# Patient Record
Sex: Female | Born: 1970 | Race: White | Hispanic: No | Marital: Married | Smoking: Never smoker
Health system: Southern US, Community
[De-identification: ages and names within clinical notes are randomized; demographics above are authoritative.]

## PROBLEM LIST (undated history)

## (undated) DIAGNOSIS — Z9889 Other specified postprocedural states: Secondary | ICD-10-CM

## (undated) DIAGNOSIS — F419 Anxiety disorder, unspecified: Secondary | ICD-10-CM

## (undated) DIAGNOSIS — F32A Depression, unspecified: Secondary | ICD-10-CM

## (undated) DIAGNOSIS — R112 Nausea with vomiting, unspecified: Secondary | ICD-10-CM

## (undated) DIAGNOSIS — D219 Benign neoplasm of connective and other soft tissue, unspecified: Secondary | ICD-10-CM

## (undated) DIAGNOSIS — N644 Mastodynia: Secondary | ICD-10-CM

## (undated) DIAGNOSIS — F329 Major depressive disorder, single episode, unspecified: Secondary | ICD-10-CM

## (undated) DIAGNOSIS — N63 Unspecified lump in unspecified breast: Secondary | ICD-10-CM

## (undated) HISTORY — DX: Anxiety disorder, unspecified: F41.9

## (undated) HISTORY — DX: Mastodynia: N64.4

## (undated) HISTORY — DX: Depression, unspecified: F32.A

## (undated) HISTORY — PX: BREAST FIBROADENOMA SURGERY: SHX580

## (undated) HISTORY — PX: KNEE ARTHROSCOPY: SUR90

## (undated) HISTORY — DX: Unspecified lump in unspecified breast: N63.0

## (undated) HISTORY — PX: BREAST EXCISIONAL BIOPSY: SUR124

## (undated) HISTORY — DX: Major depressive disorder, single episode, unspecified: F32.9

## (undated) HISTORY — PX: LAPAROSCOPIC ENDOMETRIOSIS FULGURATION: SUR769

---

## 2004-02-09 ENCOUNTER — Ambulatory Visit: Payer: Self-pay | Admitting: *Deleted

## 2004-02-16 ENCOUNTER — Ambulatory Visit: Payer: Self-pay | Admitting: *Deleted

## 2004-05-10 ENCOUNTER — Ambulatory Visit: Payer: Self-pay | Admitting: Family Medicine

## 2004-10-27 ENCOUNTER — Emergency Department (HOSPITAL_COMMUNITY): Admission: EM | Admit: 2004-10-27 | Discharge: 2004-10-27 | Payer: Self-pay | Admitting: Emergency Medicine

## 2005-04-01 ENCOUNTER — Encounter: Admission: RE | Admit: 2005-04-01 | Discharge: 2005-04-01 | Payer: Self-pay | Admitting: Emergency Medicine

## 2005-05-06 ENCOUNTER — Emergency Department (HOSPITAL_COMMUNITY): Admission: EM | Admit: 2005-05-06 | Discharge: 2005-05-06 | Payer: Self-pay | Admitting: Emergency Medicine

## 2006-11-10 ENCOUNTER — Emergency Department (HOSPITAL_COMMUNITY): Admission: EM | Admit: 2006-11-10 | Discharge: 2006-11-10 | Payer: Self-pay | Admitting: Family Medicine

## 2008-12-01 ENCOUNTER — Emergency Department (HOSPITAL_COMMUNITY): Admission: EM | Admit: 2008-12-01 | Discharge: 2008-12-01 | Payer: Self-pay | Admitting: Emergency Medicine

## 2010-06-04 ENCOUNTER — Other Ambulatory Visit (HOSPITAL_COMMUNITY)
Admission: RE | Admit: 2010-06-04 | Discharge: 2010-06-04 | Disposition: A | Discharge status: Home / Self Care | Payer: Self-pay | Source: Ambulatory Visit | Attending: Family Medicine | Admitting: Family Medicine

## 2010-06-04 DIAGNOSIS — Z124 Encounter for screening for malignant neoplasm of cervix: Secondary | ICD-10-CM | POA: Insufficient documentation

## 2010-09-03 ENCOUNTER — Other Ambulatory Visit: Payer: Self-pay | Admitting: Family Medicine

## 2010-09-06 ENCOUNTER — Other Ambulatory Visit: Payer: Self-pay | Admitting: Family Medicine

## 2010-09-06 ENCOUNTER — Ambulatory Visit
Admission: RE | Admit: 2010-09-06 | Discharge: 2010-09-06 | Disposition: A | Discharge status: Home / Self Care | Payer: BC Managed Care – PPO | Source: Ambulatory Visit | Attending: Family Medicine | Admitting: Family Medicine

## 2010-09-14 NOTE — Group Therapy Note (Signed)
Carolyn David, Carolyn David                   ACCOUNT NO.:  [id number]   MEDICAL RECORD NO.:  [id number]          PATIENT TYPE:  WOC   LOCATION:  [facility]                   FACILITY:  [facility]   PHYSICIAN:  Ellis Parents, MD    DATE OF BIRTH:  August 16, 1970   DATE OF SERVICE:  02/09/2004                                    CLINIC NOTE   REASON FOR VISIT:  This 40 year old nulliparous female comes in for a Pap  smear and refill of her birth control pills and a refill on an  antidepressant, generic Paxil.  The patient's past medical history is  essentially negative.  She had a diagnostic laparoscopy because of  progressive dysmenorrhea on Aug 29, 2003 at [place], [place] and the  findings were normal.  The patient is on Ortho-Cyclen continuously for 90  days and is now doing very well with no dysmenorrhea  and minimal withdrawal  bleeding.  The patient is currently having her menstrual period so  examination is deferred at this time.  The patient did have some counseling  in [place] for mild depression and has responded very well to Paxil.  She is  given a prescription today for generic Paxil 20 mg to be taken one daily #90  and to be seen back here in 1 week for a Pap smear.      SA/MEDQ  D:  02/09/2004  T:  02/09/2004  Job:  [id number]

## 2010-09-14 NOTE — Group Therapy Note (Signed)
[name], Carolyn David                   ACCOUNT NO.:  [id number]   MEDICAL RECORD NO.:  [id number]          PATIENT TYPE:  WOC   LOCATION:  [facility]                   FACILITY:  [facility]   PHYSICIAN:  Tinnie Gens, MD        DATE OF BIRTH:  Feb 11, 1971   DATE OF SERVICE:  05/10/2004                                    CLINIC NOTE   CHIEF COMPLAINT:  Contraception with desired IUD.   SUBJECTIVE:  Carolyn David is here today for an IUD placement.  She has read the  appropriate information and has decided on the Mirena due to the fact that  she does have some dysmenorrhea which is controlled by her birth control  pills.   PROCEDURE:  A speculum was placed while the patient was in the lithotomy  position.  The cervix was prepped with Betadine.  A tenaculum was placed at  12 o'clock and the patient was sounded to approximately 6.5.  The uterus was  noted to be anteverted prior to insertion.  The IUD was assembled and was  easily placed into the uterus.  The strings were cut and the tenaculum was  removed.  There was minimal bleeding which was controlled with silver  nitrate from the tenaculum.  After the procedure, a bimanual exam was done  and the uterus was soft and the cervix was closed, and the strings were  easily palpated.   ASSESSMENT AND PLAN:  Intrauterine device placement.  The patient had her  IUD placed as stated above without complications.  She understands it is  good for 5 years and if she has any severe bleeding, abdominal pain, or  fever she should return to maternity admissions.  She was also instructed on  how to check for the strings.      LC/MEDQ  D:  05/10/2004  T:  05/10/2004  Job:  [id number]

## 2010-09-14 NOTE — Group Therapy Note (Signed)
Carolyn David, Carolyn David                   ACCOUNT NO.:  [id number]   MEDICAL RECORD NO.:  [id number]          PATIENT TYPE:  WOC   LOCATION:  [facility]                   FACILITY:  [facility]   PHYSICIAN:  Ellis Parents, MD    DATE OF BIRTH:  02-25-1971   DATE OF SERVICE:  02/16/2004                                    CLINIC NOTE   REASON FOR VISIT:  This patient returns today for a Pap smear.  Interesting  interval history is that the patient had an ultrasound approximately 3 years  ago because of a pelvic mass which was interpreted as a fibroid of the  uterus, which the patient said was about as big as a lemon.  The  interesting thing about the finding is that the fibroid was deviated and  present on the left side of the pelvis.  The patient has no symptoms of this  and her periods are very normal.   PELVIC EXAMINATION:  External genitalia are normal, the vagina is clear, the  cervix is nulliparous and clean.  The uterus is deviated to the left with an  easily palpable fibroid estimated to be approximately 6 x 6 cm and no other  fibroids can be individually palpated.  The fibroid is definitely deviated  to the left side of the pelvis but can be moved to a midline position.   The patient was advised to consider another ultrasound within the next year  or two for follow-up.      SA/MEDQ  D:  02/16/2004  T:  02/16/2004  Job:  [id number]

## 2010-11-19 ENCOUNTER — Encounter (INDEPENDENT_AMBULATORY_CARE_PROVIDER_SITE_OTHER): Payer: Self-pay | Admitting: General Surgery

## 2010-11-19 ENCOUNTER — Ambulatory Visit (INDEPENDENT_AMBULATORY_CARE_PROVIDER_SITE_OTHER): Payer: BC Managed Care – PPO | Admitting: General Surgery

## 2010-11-19 VITALS — BP 102/84 | HR 60 | Temp 96.0°F | Ht 61.0 in | Wt 135.0 lb

## 2010-11-19 DIAGNOSIS — N6002 Solitary cyst of left breast: Secondary | ICD-10-CM

## 2010-11-19 DIAGNOSIS — N6009 Solitary cyst of unspecified breast: Secondary | ICD-10-CM

## 2010-11-19 NOTE — Progress Notes (Signed)
Carolyn David is a 40 y.o. female.    Chief Complaint  Patient presents with  . Other    new pt- painful lt br cyst    HPI HPI This patient is referred for evaluation of the tender left breast mass which she found on self breast exam in May. She's states that that she was having some tenderness in the region of the left breast and perform self breast exam and noted a lump at the 11:00 position. She states that she does do her self breast exams routinely and had not noticed this previously. Since this was discovered, she noticed some increase in size and discomfort which led to diagnostic mammogram and ultrasound which was otherwise negative except for a 3 cm simple left breast cyst in the region of her concern. Over the last 2 months it had increased in size and then decreased in size but she reports that this is still painful and limits her exercising. She can only get relief with pressure and has been doubling up on her sports bra is for relief she denies any other masses. She denies any family history of breast cancer. 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 No family history of ovarian problems other than her sister has ovarian cysts. She does report to oral contraceptive use for the last 1-1/2 years denies any childbearing.  Past Medical History  Diagnosis Date  . Breast lump     left  . Breast pain     left  . Asthma   . Anxiety   . Depression     Past Surgical History  Procedure Date  . Breast fibroadenoma surgery     left  . Laparoscopic endometriosis fulguration     negative result  . Knee arthroscopy     right    History reviewed. No pertinent family history.  Social History History  Substance Use Topics  . Smoking status: Never Smoker   . Smokeless tobacco: Not on file  . Alcohol Use: Yes    No Known Allergies  Current  Outpatient Prescriptions  Medication Sig Dispense Refill  . Cetirizine HCl (ZYRTEC ALLERGY PO) Take by mouth daily.        . norgestimate-ethinyl estradiol (ORTHO-CYCLEN) 0.25-35 MG-MCG per tablet Take 1 tablet by mouth daily.        Marland Kitchen PARoxetine (PAXIL) 10 MG tablet Take 10 mg by mouth every morning.          Review of Systems Review of Systems  Constitutional: Negative.   HENT: Negative.   Eyes: Negative.   Respiratory: Negative.   Cardiovascular: Negative.   Gastrointestinal: Negative.   Genitourinary: Negative.   Musculoskeletal: Negative.   Skin: Negative.   Neurological: Negative.   Endo/Heme/Allergies: Negative.   Psychiatric/Behavioral: Negative.     Physical Exam Physical Exam  Constitutional: She is oriented to person, place, and time. She appears well-developed and well-nourished. No distress.  HENT:  Head: Normocephalic and atraumatic.  Mouth/Throat: No oropharyngeal exudate.  Eyes: Conjunctivae and EOM are normal. Pupils are equal, round, and reactive to light. Right eye exhibits no discharge. Left eye exhibits no discharge. No scleral icterus.  Neck: Normal range of motion. Neck supple. No tracheal deviation present.  Cardiovascular: Normal rate, regular rhythm and normal heart sounds.   Respiratory: Effort normal and breath sounds normal. No  stridor. She has no wheezes. She has no rales.       Right breast: no skin changes, LAD, or suspicious masses.  Left breast: No skin changes or LAD, left breast mass at 11oclock at border of NAC just superior to prior bx scar.  [place] with high-frequency probe shows 3 cm x 3cm x 1.5cm simple cyst in area of tenderness and palpable mass, no shadowing, aspirated 10ml of thin brown/green fluid without blood. Complete resolution of cyst on [place] and exam.  Cyst fluid not sent for pathology  GI: Soft. Bowel sounds are normal. She exhibits no distension and no mass. There is no tenderness. There is no rebound and no guarding.    Musculoskeletal: Normal range of motion. She exhibits no edema and no tenderness.  Neurological: She is alert and oriented to person, place, and time. She has normal reflexes.  Skin: Skin is warm and dry. No rash noted. She is not diaphoretic. No erythema. No pallor.  Psychiatric: She has a normal mood and affect. Her behavior is normal. Judgment and thought content normal.     Blood pressure 102/84, pulse 60, temperature 96 F (35.6 C), height 5\' 1"  (1.549 m), weight 135 lb (61.236 kg).  Assessment/Plan Simple left breast cyst  Given the fact that her left breast cyst was symptomatic, I discussed with her the risks of continued observation with self breast exam and repeat ultrasound versus aspiration in clinic today for relief of her symptoms versus surgical excision and biopsy and the risks and benefits of each. She elected to have a cyst aspiration which we performed at the bedside with ultrasound guidance today. She had complete resolution of her cyst on imaging and on physical exam. She will followup with me in 3 months with a repeat ultrasound of the left breast and continued monthly self breast exam. If the cyst recurs or her symptoms recur then she will follow up sooner. If the cyst does recur we will consider repeat aspiration or surgical excision. Given the appearance on [place] and the history and exam I doubt that this is a malignant lesion.  Carolyn David 11/19/2010, 11:09 AM

## 2011-02-05 ENCOUNTER — Encounter (INDEPENDENT_AMBULATORY_CARE_PROVIDER_SITE_OTHER): Payer: Self-pay | Admitting: General Surgery

## 2011-02-12 LAB — POCT H PYLORI SCREEN: H. PYLORI SCREEN, POC: NEGATIVE

## 2011-02-12 LAB — CBC
HCT: 37.2
Hemoglobin: 12.2
MCHC: 32.9
MCV: 77.6 — ABNORMAL LOW
Platelets: 194
RBC: 4.8
RDW: 14.9 — ABNORMAL HIGH
WBC: 4.5

## 2011-02-12 LAB — DIFFERENTIAL
Basophils Absolute: 0
Basophils Relative: 1
Eosinophils Absolute: 0.2
Eosinophils Relative: 4
Lymphocytes Relative: 30
Lymphs Abs: 1.3
Monocytes Absolute: 0.5
Monocytes Relative: 11
Neutro Abs: 2.5
Neutrophils Relative %: 56

## 2011-02-19 ENCOUNTER — Other Ambulatory Visit: Payer: BC Managed Care – PPO

## 2011-07-25 ENCOUNTER — Other Ambulatory Visit (HOSPITAL_COMMUNITY)
Admission: RE | Admit: 2011-07-25 | Discharge: 2011-07-25 | Disposition: A | Discharge status: Home / Self Care | Payer: BC Managed Care – PPO | Source: Ambulatory Visit | Attending: Obstetrics and Gynecology | Admitting: Obstetrics and Gynecology

## 2011-07-25 DIAGNOSIS — Z01419 Encounter for gynecological examination (general) (routine) without abnormal findings: Secondary | ICD-10-CM | POA: Insufficient documentation

## 2011-08-08 ENCOUNTER — Other Ambulatory Visit: Payer: Self-pay | Admitting: Obstetrics and Gynecology

## 2011-08-08 DIAGNOSIS — D219 Benign neoplasm of connective and other soft tissue, unspecified: Secondary | ICD-10-CM

## 2011-08-12 ENCOUNTER — Other Ambulatory Visit: Payer: Self-pay | Admitting: Obstetrics and Gynecology

## 2011-08-12 DIAGNOSIS — D219 Benign neoplasm of connective and other soft tissue, unspecified: Secondary | ICD-10-CM

## 2011-08-12 DIAGNOSIS — N920 Excessive and frequent menstruation with regular cycle: Secondary | ICD-10-CM

## 2011-08-13 ENCOUNTER — Ambulatory Visit
Admission: RE | Admit: 2011-08-13 | Discharge: 2011-08-13 | Disposition: A | Discharge status: Home / Self Care | Payer: BC Managed Care – PPO | Source: Ambulatory Visit | Attending: Obstetrics and Gynecology | Admitting: Obstetrics and Gynecology

## 2011-08-13 DIAGNOSIS — D219 Benign neoplasm of connective and other soft tissue, unspecified: Secondary | ICD-10-CM

## 2011-08-13 HISTORY — DX: Benign neoplasm of connective and other soft tissue, unspecified: D21.9

## 2011-08-13 NOTE — Progress Notes (Signed)
LMP:  08/06/2011.  Length of cycles: 5-7 days, with 2 days of heavy flow w/ clots.  Using pads & tampons, changes Q 2-3 hrs on heavy days.    Bulk Sx:  Urinary frequency;  Stress incontinence.  Pelvic pain w/ intercourse.  G:  0  To adopt newborn in August 2013.   Open adoption.

## 2011-08-16 ENCOUNTER — Ambulatory Visit
Admission: RE | Admit: 2011-08-16 | Discharge: 2011-08-16 | Disposition: A | Discharge status: Home / Self Care | Payer: BC Managed Care – PPO | Source: Ambulatory Visit | Attending: Obstetrics and Gynecology | Admitting: Obstetrics and Gynecology

## 2011-08-16 DIAGNOSIS — N920 Excessive and frequent menstruation with regular cycle: Secondary | ICD-10-CM

## 2011-08-16 DIAGNOSIS — D219 Benign neoplasm of connective and other soft tissue, unspecified: Secondary | ICD-10-CM

## 2011-08-16 MED ORDER — GADOBENATE DIMEGLUMINE 529 MG/ML IV SOLN
12.0000 mL | Freq: Once | INTRAVENOUS | Status: AC | PRN
  Administered 2011-08-16: 12 mL via INTRAVENOUS

## 2012-08-27 ENCOUNTER — Other Ambulatory Visit: Payer: Self-pay | Admitting: Family Medicine

## 2012-08-27 DIAGNOSIS — D219 Benign neoplasm of connective and other soft tissue, unspecified: Secondary | ICD-10-CM

## 2012-09-22 ENCOUNTER — Ambulatory Visit
Admission: RE | Admit: 2012-09-22 | Discharge: 2012-09-22 | Disposition: A | Discharge status: Home / Self Care | Payer: BC Managed Care – PPO | Source: Ambulatory Visit | Attending: Family Medicine | Admitting: Family Medicine

## 2012-09-22 DIAGNOSIS — D219 Benign neoplasm of connective and other soft tissue, unspecified: Secondary | ICD-10-CM

## 2012-09-24 ENCOUNTER — Other Ambulatory Visit: Payer: Self-pay | Admitting: Obstetrics and Gynecology

## 2012-09-24 DIAGNOSIS — D259 Leiomyoma of uterus, unspecified: Secondary | ICD-10-CM

## 2012-10-08 ENCOUNTER — Ambulatory Visit
Admission: RE | Admit: 2012-10-08 | Discharge: 2012-10-08 | Disposition: A | Discharge status: Home / Self Care | Payer: BC Managed Care – PPO | Source: Ambulatory Visit | Attending: Obstetrics and Gynecology | Admitting: Obstetrics and Gynecology

## 2012-10-08 DIAGNOSIS — D259 Leiomyoma of uterus, unspecified: Secondary | ICD-10-CM

## 2012-10-08 MED ORDER — GADOBENATE DIMEGLUMINE 529 MG/ML IV SOLN
12.0000 mL | Freq: Once | INTRAVENOUS | Status: AC | PRN
  Administered 2012-10-08: 12 mL via INTRAVENOUS

## 2012-10-15 ENCOUNTER — Other Ambulatory Visit: Payer: Self-pay | Admitting: Oncology

## 2012-10-15 ENCOUNTER — Telehealth: Payer: Self-pay | Admitting: Emergency Medicine

## 2012-10-15 ENCOUNTER — Other Ambulatory Visit: Payer: Self-pay | Admitting: Diagnostic Radiology

## 2012-10-15 DIAGNOSIS — N920 Excessive and frequent menstruation with regular cycle: Secondary | ICD-10-CM

## 2012-10-15 NOTE — Telephone Encounter (Signed)
LM FOR PT TO CALL BACK ABOUT MRI RESULTS AND [place] PROCEDURE.  12:11PM- PT CALLED BACK TO SAY SHE IS GOOD TO GO FOR THE [place] W/ DR HENN.  WE WILL SUBMIT TO [employer] AND WILL HAVE TINA TO CONTACT HER TO SET UP APPT.

## 2012-12-17 ENCOUNTER — Encounter (HOSPITAL_COMMUNITY): Payer: Self-pay | Admitting: Pharmacy Technician

## 2012-12-22 ENCOUNTER — Other Ambulatory Visit: Payer: Self-pay | Admitting: Radiology

## 2012-12-24 ENCOUNTER — Ambulatory Visit (HOSPITAL_COMMUNITY)
Admission: RE | Admit: 2012-12-24 | Discharge: 2012-12-24 | Disposition: A | Discharge status: Home / Self Care | Payer: BC Managed Care – PPO | Source: Ambulatory Visit | Attending: Diagnostic Radiology | Admitting: Diagnostic Radiology

## 2012-12-24 ENCOUNTER — Observation Stay (HOSPITAL_COMMUNITY)
Admission: RE | Admit: 2012-12-24 | Discharge: 2012-12-25 | Disposition: A | Discharge status: Home / Self Care | Payer: BC Managed Care – PPO | Source: Ambulatory Visit | Attending: Diagnostic Radiology | Admitting: Diagnostic Radiology

## 2012-12-24 ENCOUNTER — Encounter (HOSPITAL_COMMUNITY): Payer: Self-pay

## 2012-12-24 ENCOUNTER — Other Ambulatory Visit: Payer: Self-pay | Admitting: Diagnostic Radiology

## 2012-12-24 ENCOUNTER — Other Ambulatory Visit: Payer: Self-pay | Admitting: Radiology

## 2012-12-24 VITALS — BP 116/66 | HR 82 | Temp 98.1°F | Resp 16 | Ht 61.0 in | Wt 140.0 lb

## 2012-12-24 DIAGNOSIS — D259 Leiomyoma of uterus, unspecified: Principal | ICD-10-CM | POA: Insufficient documentation

## 2012-12-24 DIAGNOSIS — N920 Excessive and frequent menstruation with regular cycle: Secondary | ICD-10-CM

## 2012-12-24 DIAGNOSIS — Z79899 Other long term (current) drug therapy: Secondary | ICD-10-CM | POA: Insufficient documentation

## 2012-12-24 DIAGNOSIS — D219 Benign neoplasm of connective and other soft tissue, unspecified: Secondary | ICD-10-CM | POA: Diagnosis present

## 2012-12-24 DIAGNOSIS — J45909 Unspecified asthma, uncomplicated: Secondary | ICD-10-CM | POA: Insufficient documentation

## 2012-12-24 HISTORY — DX: Other specified postprocedural states: Z98.890

## 2012-12-24 HISTORY — DX: Nausea with vomiting, unspecified: R11.2

## 2012-12-24 LAB — BASIC METABOLIC PANEL
BUN: 9 mg/dL (ref 6–23)
CO2: 24 mEq/L (ref 19–32)
Glucose, Bld: 83 mg/dL (ref 70–99)
Potassium: 3.7 mEq/L (ref 3.5–5.1)
Sodium: 134 mEq/L — ABNORMAL LOW (ref 135–145)

## 2012-12-24 LAB — CBC
Hemoglobin: 10.8 g/dL — ABNORMAL LOW (ref 12.0–15.0)
MCH: 22.9 pg — ABNORMAL LOW (ref 26.0–34.0)
MCHC: 31.8 g/dL (ref 30.0–36.0)
MCV: 72.2 fL — ABNORMAL LOW (ref 78.0–100.0)
RBC: 4.71 MIL/uL (ref 3.87–5.11)

## 2012-12-24 MED ORDER — HYDROMORPHONE HCL PF 2 MG/ML IJ SOLN
INTRAMUSCULAR | Status: AC
  Filled 2012-12-24: qty 1

## 2012-12-24 MED ORDER — PROMETHAZINE HCL 25 MG PO TABS: 25.0000 mg | ORAL_TABLET | Freq: Three times a day (TID) | ORAL | Status: DC | PRN

## 2012-12-24 MED ORDER — KETOROLAC TROMETHAMINE 30 MG/ML IJ SOLN
INTRAMUSCULAR | Status: AC
  Filled 2012-12-24: qty 1

## 2012-12-24 MED ORDER — DIPHENHYDRAMINE HCL 50 MG/ML IJ SOLN: 12.5000 mg | Freq: Four times a day (QID) | INTRAMUSCULAR | Status: DC | PRN

## 2012-12-24 MED ORDER — SODIUM CHLORIDE 0.9 % IJ SOLN: 3.0000 mL | INTRAMUSCULAR | Status: DC | PRN

## 2012-12-24 MED ORDER — MIDAZOLAM HCL 2 MG/2ML IJ SOLN
INTRAMUSCULAR | Status: AC | PRN
  Administered 2012-12-24 (×6): 1 mg via INTRAVENOUS

## 2012-12-24 MED ORDER — LIDOCAINE HCL 1 % IJ SOLN
INTRAMUSCULAR | Status: AC
  Filled 2012-12-24: qty 20

## 2012-12-24 MED ORDER — SODIUM CHLORIDE 0.9 % IV SOLN: 250.0000 mL | INTRAVENOUS | Status: DC | PRN

## 2012-12-24 MED ORDER — ONDANSETRON HCL 4 MG/2ML IJ SOLN: 4.0000 mg | Freq: Four times a day (QID) | INTRAMUSCULAR | Status: DC | PRN

## 2012-12-24 MED ORDER — DIPHENHYDRAMINE HCL 12.5 MG/5ML PO ELIX: 12.5000 mg | ORAL_SOLUTION | Freq: Four times a day (QID) | ORAL | Status: DC | PRN

## 2012-12-24 MED ORDER — IOHEXOL 300 MG/ML  SOLN
100.0000 mL | Freq: Once | INTRAMUSCULAR | Status: AC | PRN
  Administered 2012-12-24: 1 mL

## 2012-12-24 MED ORDER — SODIUM CHLORIDE 0.9 % IV SOLN
INTRAVENOUS | Status: DC
  Administered 2012-12-24 (×2): via INTRAVENOUS

## 2012-12-24 MED ORDER — KETOROLAC TROMETHAMINE 30 MG/ML IJ SOLN
30.0000 mg | Freq: Four times a day (QID) | INTRAMUSCULAR | Status: DC
  Administered 2012-12-24 – 2012-12-25 (×3): 30 mg via INTRAVENOUS
  Filled 2012-12-24 (×3): qty 1

## 2012-12-24 MED ORDER — PROMETHAZINE HCL 25 MG RE SUPP
25.0000 mg | Freq: Three times a day (TID) | RECTAL | Status: DC | PRN
  Filled 2012-12-24: qty 1

## 2012-12-24 MED ORDER — CEFAZOLIN SODIUM-DEXTROSE 2-3 GM-% IV SOLR
INTRAVENOUS | Status: AC
  Filled 2012-12-24: qty 50

## 2012-12-24 MED ORDER — SODIUM CHLORIDE 0.9 % IJ SOLN: 9.0000 mL | INTRAMUSCULAR | Status: DC | PRN

## 2012-12-24 MED ORDER — DOCUSATE SODIUM 100 MG PO CAPS
100.0000 mg | ORAL_CAPSULE | Freq: Two times a day (BID) | ORAL | Status: DC
  Administered 2012-12-24 – 2012-12-25 (×2): 100 mg via ORAL
  Filled 2012-12-24 (×3): qty 1

## 2012-12-24 MED ORDER — FENTANYL CITRATE 0.05 MG/ML IJ SOLN
INTRAMUSCULAR | Status: AC | PRN
  Administered 2012-12-24 (×4): 50 ug via INTRAVENOUS

## 2012-12-24 MED ORDER — SODIUM CHLORIDE 0.9 % IJ SOLN
3.0000 mL | Freq: Two times a day (BID) | INTRAMUSCULAR | Status: DC
  Administered 2012-12-24: 3 mL via INTRAVENOUS

## 2012-12-24 MED ORDER — HYDROMORPHONE HCL PF 1 MG/ML IJ SOLN
INTRAMUSCULAR | Status: DC | PRN
  Administered 2012-12-24: 1 mg via INTRAVENOUS

## 2012-12-24 MED ORDER — FENTANYL CITRATE 0.05 MG/ML IJ SOLN: 100.0000 ug | Freq: Once | INTRAMUSCULAR | Status: DC

## 2012-12-24 MED ORDER — MIDAZOLAM HCL 2 MG/2ML IJ SOLN
INTRAMUSCULAR | Status: AC
  Filled 2012-12-24: qty 6

## 2012-12-24 MED ORDER — HYDROMORPHONE 0.3 MG/ML IV SOLN
INTRAVENOUS | Status: DC
  Administered 2012-12-24: 9 mg via INTRAVENOUS
  Administered 2012-12-24 (×2): via INTRAVENOUS
  Administered 2012-12-25: 2.7 mg via INTRAVENOUS
  Administered 2012-12-25: 6.3 mg via INTRAVENOUS
  Administered 2012-12-25: 3.45 mg via INTRAVENOUS
  Filled 2012-12-24 (×3): qty 25

## 2012-12-24 MED ORDER — CEFAZOLIN SODIUM-DEXTROSE 2-3 GM-% IV SOLR
2.0000 g | INTRAVENOUS | Status: AC
  Administered 2012-12-24: 2 g via INTRAVENOUS

## 2012-12-24 MED ORDER — HYDROMORPHONE HCL PF 1 MG/ML IJ SOLN
INTRAMUSCULAR | Status: AC | PRN
  Administered 2012-12-24: 0.5 mg via INTRAVENOUS
  Administered 2012-12-24: 1 mg via INTRAVENOUS
  Administered 2012-12-24: 0.5 mg via INTRAVENOUS
  Administered 2012-12-24: 1 mg via INTRAVENOUS

## 2012-12-24 MED ORDER — NALOXONE HCL 0.4 MG/ML IJ SOLN: 0.4000 mg | INTRAMUSCULAR | Status: DC | PRN

## 2012-12-24 MED ORDER — FENTANYL CITRATE 0.05 MG/ML IJ SOLN
INTRAMUSCULAR | Status: AC
  Administered 2012-12-24: 14:00:00
  Filled 2012-12-24: qty 4

## 2012-12-24 MED ORDER — HYDROMORPHONE 0.3 MG/ML IV SOLN: INTRAVENOUS | Status: DC

## 2012-12-24 MED ORDER — KETOROLAC TROMETHAMINE 30 MG/ML IJ SOLN
30.0000 mg | INTRAMUSCULAR | Status: AC
  Administered 2012-12-24: 30 mg via INTRAVENOUS
  Filled 2012-12-24: qty 1

## 2012-12-24 MED ORDER — FENTANYL CITRATE 0.05 MG/ML IJ SOLN
INTRAMUSCULAR | Status: AC
  Filled 2012-12-24: qty 6

## 2012-12-24 NOTE — Progress Notes (Signed)
Report called to RN, Pt transferred to room 1534 in stable condition.

## 2012-12-24 NOTE — Progress Notes (Signed)
Subjective: Pt doing fairly well; has some intermittent pelvic cramping ; denies N/V  Objective: Vital signs in last 24 hours: Temp:  [98 F (36.7 C)] 98 F (36.7 C) (08/28 1215) Pulse Rate:  [65-82] 79 (08/28 1400) Resp:  [8-18] 18 (08/28 1400) BP: (103-148)/(63-92) 144/72 mmHg (08/28 1400) SpO2:  [97 %-100 %] 97 % (08/28 1400) FiO2 (%):  [82 %] 82 % (08/28 1300) Weight:  [140 lb (63.504 kg)] 140 lb (63.504 kg) (08/28 0739)    Intake/Output from previous day:   Intake/Output this shift:    Pt awake, sl drowsy; puncture site right CFA clean and dry,NT, no hematoma; intact distal pulses; mild pelvic tenderness  Lab Results:   Recent Labs  12/24/12 0755  WBC 4.8  HGB 10.8*  HCT 34.0*  PLT 252   BMET  Recent Labs  12/24/12 0755  NA 134*  K 3.7  CL 100  CO2 24  GLUCOSE 83  BUN 9  CREATININE 0.71  CALCIUM 8.6   PT/INR  Recent Labs  12/24/12 0755  LABPROT 12.3  INR 0.93   ABG No results found for this basename: PHART, PCO2, PO2, HCO3,  in the last 72 hours  Studies/Results: Ir Angiogram Pelvis Selective Or Supraselective  12/24/2012   *RADIOLOGY REPORT*  Indication: 42 year old with menorrhagia and uterine fibroids  PROCEDURE(S):  BILATERAL UTERINE ARTERY EMBOLIZATION; BILATERAL PELVIC ANGIOGRAPHY; ULTRASOUND GUIDANCE FOR VASCULAR ACCESS  Physician:  Rachelle Hora. Henn, MD  Medications: Versed 6 mg, Fentanyl 200 mcg.  Toradol 30 mg, Dilaudid 2 mg.  Ancef 2 grams. A radiology nurse monitored the patient for moderate sedation.  As antibiotic prophylaxis, Ancef was ordered pre-procedure and administered intravenously within one hour of incision.  Moderate sedation time: 1 hour and 45 minutes  Fluoroscopy time:  31 minutes and 6 seconds  Procedure:The procedure was explained to the patient.  The risks and benefits of the procedure were discussed and the patient's questions were addressed.  Informed consent was obtained from the patient.  The patient was placed supine  on the interventional table.  The patient had palpable pedal pulses.  The right groin was prepped and draped in a sterile fashion.  Maximal barrier sterile technique was utilized including caps, mask, sterile gowns, sterile gloves, sterile drape, hand hygiene and skin antiseptic.  The skin was anesthetized 1% lidocaine.  Using ultrasound guidance, 21 gauge needle was directed in the right common femoral artery and a micropuncture dilator set was placed.  The vascular access was upsized to a 5-French vascular sheath.  A Cobra catheter was used to cannulate the left common iliac artery and the left internal iliac artery.  A series of arteriograms were performed to identify the left uterine artery origin.  A Progreat microcatheter was advanced into the left uterine artery.  Two vials of Embospheres (500 - 700 micron) were injected through the microcatheter with fluoroscopic guidance.  There was near complete stasis of the left uterine artery following administration of the particles on the followup angiograms.  A Waltman's loop was formed using the Cobra catheter and the right internal iliac artery was cannulated.  The right uterine artery was identified with contrast angiograms.  The microcatheter was advanced into the right uterine artery and a series of angiograms were performed.  One vial of Embospheres (500- 700 micron) was injected through the microcatheter with fluoroscopic guidance.  Post embolization angiography demonstrated near complete stasis of the right uterine artery at the end of the procedure.  The microcatheter was removed.  The Cobra catheter was straightened out over the aortic bifurcation and removed over a wire.  Angiogram was performed through the right groin sheath.  The vascular sheath was removed with an Exoseal closure device.Fluoroscopic and ultrasound images were taken and saved for documentation.  Findings:Large uterine arteries bilaterally.  Near complete stasis of the uterine arteries at  the end of the procedure.  Extensive contrast staining of the dominant fibroids during and after the embolization procedure.  Complicatons: None  Impression:Successful uterine artery embolization procedure.   Original Report Authenticated By: Richarda Overlie, M.D.   Ir Angiogram Pelvis Selective Or Supraselective  12/24/2012   *RADIOLOGY REPORT*  Indication: 42 year old with menorrhagia and uterine fibroids  PROCEDURE(S):  BILATERAL UTERINE ARTERY EMBOLIZATION; BILATERAL PELVIC ANGIOGRAPHY; ULTRASOUND GUIDANCE FOR VASCULAR ACCESS  Physician:  Rachelle Hora. Henn, MD  Medications: Versed 6 mg, Fentanyl 200 mcg.  Toradol 30 mg, Dilaudid 2 mg.  Ancef 2 grams. A radiology nurse monitored the patient for moderate sedation.  As antibiotic prophylaxis, Ancef was ordered pre-procedure and administered intravenously within one hour of incision.  Moderate sedation time: 1 hour and 45 minutes  Fluoroscopy time:  31 minutes and 6 seconds  Procedure:The procedure was explained to the patient.  The risks and benefits of the procedure were discussed and the patient's questions were addressed.  Informed consent was obtained from the patient.  The patient was placed supine on the interventional table.  The patient had palpable pedal pulses.  The right groin was prepped and draped in a sterile fashion.  Maximal barrier sterile technique was utilized including caps, mask, sterile gowns, sterile gloves, sterile drape, hand hygiene and skin antiseptic.  The skin was anesthetized 1% lidocaine.  Using ultrasound guidance, 21 gauge needle was directed in the right common femoral artery and a micropuncture dilator set was placed.  The vascular access was upsized to a 5-French vascular sheath.  A Cobra catheter was used to cannulate the left common iliac artery and the left internal iliac artery.  A series of arteriograms were performed to identify the left uterine artery origin.  A Progreat microcatheter was advanced into the left uterine artery.   Two vials of Embospheres (500 - 700 micron) were injected through the microcatheter with fluoroscopic guidance.  There was near complete stasis of the left uterine artery following administration of the particles on the followup angiograms.  A Waltman's loop was formed using the Cobra catheter and the right internal iliac artery was cannulated.  The right uterine artery was identified with contrast angiograms.  The microcatheter was advanced into the right uterine artery and a series of angiograms were performed.  One vial of Embospheres (500- 700 micron) was injected through the microcatheter with fluoroscopic guidance.  Post embolization angiography demonstrated near complete stasis of the right uterine artery at the end of the procedure.  The microcatheter was removed.  The Cobra catheter was straightened out over the aortic bifurcation and removed over a wire.  Angiogram was performed through the right groin sheath.  The vascular sheath was removed with an Exoseal closure device.Fluoroscopic and ultrasound images were taken and saved for documentation.  Findings:Large uterine arteries bilaterally.  Near complete stasis of the uterine arteries at the end of the procedure.  Extensive contrast staining of the dominant fibroids during and after the embolization procedure.  Complicatons: None  Impression:Successful uterine artery embolization procedure.   Original Report Authenticated By: Richarda Overlie, M.D.   Ir Angiogram Selective Each Additional Vessel  12/24/2012   *RADIOLOGY  REPORT*  Indication: 42 year old with menorrhagia and uterine fibroids  PROCEDURE(S):  BILATERAL UTERINE ARTERY EMBOLIZATION; BILATERAL PELVIC ANGIOGRAPHY; ULTRASOUND GUIDANCE FOR VASCULAR ACCESS  Physician:  Rachelle Hora. Henn, MD  Medications: Versed 6 mg, Fentanyl 200 mcg.  Toradol 30 mg, Dilaudid 2 mg.  Ancef 2 grams. A radiology nurse monitored the patient for moderate sedation.  As antibiotic prophylaxis, Ancef was ordered pre-procedure and  administered intravenously within one hour of incision.  Moderate sedation time: 1 hour and 45 minutes  Fluoroscopy time:  31 minutes and 6 seconds  Procedure:The procedure was explained to the patient.  The risks and benefits of the procedure were discussed and the patient's questions were addressed.  Informed consent was obtained from the patient.  The patient was placed supine on the interventional table.  The patient had palpable pedal pulses.  The right groin was prepped and draped in a sterile fashion.  Maximal barrier sterile technique was utilized including caps, mask, sterile gowns, sterile gloves, sterile drape, hand hygiene and skin antiseptic.  The skin was anesthetized 1% lidocaine.  Using ultrasound guidance, 21 gauge needle was directed in the right common femoral artery and a micropuncture dilator set was placed.  The vascular access was upsized to a 5-French vascular sheath.  A Cobra catheter was used to cannulate the left common iliac artery and the left internal iliac artery.  A series of arteriograms were performed to identify the left uterine artery origin.  A Progreat microcatheter was advanced into the left uterine artery.  Two vials of Embospheres (500 - 700 micron) were injected through the microcatheter with fluoroscopic guidance.  There was near complete stasis of the left uterine artery following administration of the particles on the followup angiograms.  A Waltman's loop was formed using the Cobra catheter and the right internal iliac artery was cannulated.  The right uterine artery was identified with contrast angiograms.  The microcatheter was advanced into the right uterine artery and a series of angiograms were performed.  One vial of Embospheres (500- 700 micron) was injected through the microcatheter with fluoroscopic guidance.  Post embolization angiography demonstrated near complete stasis of the right uterine artery at the end of the procedure.  The microcatheter was removed.   The Cobra catheter was straightened out over the aortic bifurcation and removed over a wire.  Angiogram was performed through the right groin sheath.  The vascular sheath was removed with an Exoseal closure device.Fluoroscopic and ultrasound images were taken and saved for documentation.  Findings:Large uterine arteries bilaterally.  Near complete stasis of the uterine arteries at the end of the procedure.  Extensive contrast staining of the dominant fibroids during and after the embolization procedure.  Complicatons: None  Impression:Successful uterine artery embolization procedure.   Original Report Authenticated By: Richarda Overlie, M.D.   Ir Angiogram Selective Each Additional Vessel  12/24/2012   *RADIOLOGY REPORT*  Indication: 42 year old with menorrhagia and uterine fibroids  PROCEDURE(S):  BILATERAL UTERINE ARTERY EMBOLIZATION; BILATERAL PELVIC ANGIOGRAPHY; ULTRASOUND GUIDANCE FOR VASCULAR ACCESS  Physician:  Rachelle Hora. Henn, MD  Medications: Versed 6 mg, Fentanyl 200 mcg.  Toradol 30 mg, Dilaudid 2 mg.  Ancef 2 grams. A radiology nurse monitored the patient for moderate sedation.  As antibiotic prophylaxis, Ancef was ordered pre-procedure and administered intravenously within one hour of incision.  Moderate sedation time: 1 hour and 45 minutes  Fluoroscopy time:  31 minutes and 6 seconds  Procedure:The procedure was explained to the patient.  The risks and benefits of the  procedure were discussed and the patient's questions were addressed.  Informed consent was obtained from the patient.  The patient was placed supine on the interventional table.  The patient had palpable pedal pulses.  The right groin was prepped and draped in a sterile fashion.  Maximal barrier sterile technique was utilized including caps, mask, sterile gowns, sterile gloves, sterile drape, hand hygiene and skin antiseptic.  The skin was anesthetized 1% lidocaine.  Using ultrasound guidance, 21 gauge needle was directed in the right common  femoral artery and a micropuncture dilator set was placed.  The vascular access was upsized to a 5-French vascular sheath.  A Cobra catheter was used to cannulate the left common iliac artery and the left internal iliac artery.  A series of arteriograms were performed to identify the left uterine artery origin.  A Progreat microcatheter was advanced into the left uterine artery.  Two vials of Embospheres (500 - 700 micron) were injected through the microcatheter with fluoroscopic guidance.  There was near complete stasis of the left uterine artery following administration of the particles on the followup angiograms.  A Waltman's loop was formed using the Cobra catheter and the right internal iliac artery was cannulated.  The right uterine artery was identified with contrast angiograms.  The microcatheter was advanced into the right uterine artery and a series of angiograms were performed.  One vial of Embospheres (500- 700 micron) was injected through the microcatheter with fluoroscopic guidance.  Post embolization angiography demonstrated near complete stasis of the right uterine artery at the end of the procedure.  The microcatheter was removed.  The Cobra catheter was straightened out over the aortic bifurcation and removed over a wire.  Angiogram was performed through the right groin sheath.  The vascular sheath was removed with an Exoseal closure device.Fluoroscopic and ultrasound images were taken and saved for documentation.  Findings:Large uterine arteries bilaterally.  Near complete stasis of the uterine arteries at the end of the procedure.  Extensive contrast staining of the dominant fibroids during and after the embolization procedure.  Complicatons: None  Impression:Successful uterine artery embolization procedure.   Original Report Authenticated By: Richarda Overlie, M.D.   Ir US Guide Vasc Access Right  12/24/2012   *RADIOLOGY REPORT*  Indication: 42 year old with menorrhagia and uterine fibroids   PROCEDURE(S):  BILATERAL UTERINE ARTERY EMBOLIZATION; BILATERAL PELVIC ANGIOGRAPHY; ULTRASOUND GUIDANCE FOR VASCULAR ACCESS  Physician:  Rachelle Hora. Henn, MD  Medications: Versed 6 mg, Fentanyl 200 mcg.  Toradol 30 mg, Dilaudid 2 mg.  Ancef 2 grams. A radiology nurse monitored the patient for moderate sedation.  As antibiotic prophylaxis, Ancef was ordered pre-procedure and administered intravenously within one hour of incision.  Moderate sedation time: 1 hour and 45 minutes  Fluoroscopy time:  31 minutes and 6 seconds  Procedure:The procedure was explained to the patient.  The risks and benefits of the procedure were discussed and the patient's questions were addressed.  Informed consent was obtained from the patient.  The patient was placed supine on the interventional table.  The patient had palpable pedal pulses.  The right groin was prepped and draped in a sterile fashion.  Maximal barrier sterile technique was utilized including caps, mask, sterile gowns, sterile gloves, sterile drape, hand hygiene and skin antiseptic.  The skin was anesthetized 1% lidocaine.  Using ultrasound guidance, 21 gauge needle was directed in the right common femoral artery and a micropuncture dilator set was placed.  The vascular access was upsized to a 5-French vascular sheath.  A Cobra catheter was used to cannulate the left common iliac artery and the left internal iliac artery.  A series of arteriograms were performed to identify the left uterine artery origin.  A Progreat microcatheter was advanced into the left uterine artery.  Two vials of Embospheres (500 - 700 micron) were injected through the microcatheter with fluoroscopic guidance.  There was near complete stasis of the left uterine artery following administration of the particles on the followup angiograms.  A Waltman's loop was formed using the Cobra catheter and the right internal iliac artery was cannulated.  The right uterine artery was identified with contrast  angiograms.  The microcatheter was advanced into the right uterine artery and a series of angiograms were performed.  One vial of Embospheres (500- 700 micron) was injected through the microcatheter with fluoroscopic guidance.  Post embolization angiography demonstrated near complete stasis of the right uterine artery at the end of the procedure.  The microcatheter was removed.  The Cobra catheter was straightened out over the aortic bifurcation and removed over a wire.  Angiogram was performed through the right groin sheath.  The vascular sheath was removed with an Exoseal closure device.Fluoroscopic and ultrasound images were taken and saved for documentation.  Findings:Large uterine arteries bilaterally.  Near complete stasis of the uterine arteries at the end of the procedure.  Extensive contrast staining of the dominant fibroids during and after the embolization procedure.  Complicatons: None  Impression:Successful uterine artery embolization procedure.   Original Report Authenticated By: Richarda Overlie, M.D.   Ir Embo Tumor Organ Ischemia Infarct Inc Guide Roadmapping  12/24/2012   *RADIOLOGY REPORT*  Indication: 42 year old with menorrhagia and uterine fibroids  PROCEDURE(S):  BILATERAL UTERINE ARTERY EMBOLIZATION; BILATERAL PELVIC ANGIOGRAPHY; ULTRASOUND GUIDANCE FOR VASCULAR ACCESS  Physician:  Rachelle Hora. Henn, MD  Medications: Versed 6 mg, Fentanyl 200 mcg.  Toradol 30 mg, Dilaudid 2 mg.  Ancef 2 grams. A radiology nurse monitored the patient for moderate sedation.  As antibiotic prophylaxis, Ancef was ordered pre-procedure and administered intravenously within one hour of incision.  Moderate sedation time: 1 hour and 45 minutes  Fluoroscopy time:  31 minutes and 6 seconds  Procedure:The procedure was explained to the patient.  The risks and benefits of the procedure were discussed and the patient's questions were addressed.  Informed consent was obtained from the patient.  The patient was placed supine on the  interventional table.  The patient had palpable pedal pulses.  The right groin was prepped and draped in a sterile fashion.  Maximal barrier sterile technique was utilized including caps, mask, sterile gowns, sterile gloves, sterile drape, hand hygiene and skin antiseptic.  The skin was anesthetized 1% lidocaine.  Using ultrasound guidance, 21 gauge needle was directed in the right common femoral artery and a micropuncture dilator set was placed.  The vascular access was upsized to a 5-French vascular sheath.  A Cobra catheter was used to cannulate the left common iliac artery and the left internal iliac artery.  A series of arteriograms were performed to identify the left uterine artery origin.  A Progreat microcatheter was advanced into the left uterine artery.  Two vials of Embospheres (500 - 700 micron) were injected through the microcatheter with fluoroscopic guidance.  There was near complete stasis of the left uterine artery following administration of the particles on the followup angiograms.  A Waltman's loop was formed using the Cobra catheter and the right internal iliac artery was cannulated.  The right uterine artery was identified  with contrast angiograms.  The microcatheter was advanced into the right uterine artery and a series of angiograms were performed.  One vial of Embospheres (500- 700 micron) was injected through the microcatheter with fluoroscopic guidance.  Post embolization angiography demonstrated near complete stasis of the right uterine artery at the end of the procedure.  The microcatheter was removed.  The Cobra catheter was straightened out over the aortic bifurcation and removed over a wire.  Angiogram was performed through the right groin sheath.  The vascular sheath was removed with an Exoseal closure device.Fluoroscopic and ultrasound images were taken and saved for documentation.  Findings:Large uterine arteries bilaterally.  Near complete stasis of the uterine arteries at the  end of the procedure.  Extensive contrast staining of the dominant fibroids during and after the embolization procedure.  Complicatons: None  Impression:Successful uterine artery embolization procedure.   Original Report Authenticated By: Richarda Overlie, M.D.    Anti-infectives: Anti-infectives   Start     Dose/Rate Route Frequency Ordered Stop   12/24/12 0745  ceFAZolin (ANCEF) IVPB 2 g/50 mL premix     2 g 100 mL/hr over 30 Minutes Intravenous On call 12/24/12 0738 12/24/12 1009      Assessment/Plan: S/p bilat [place] 8/28 for symptomatic uterine fibroids;  overnight obs for pain control; hydration; f/u with Dr. Lowella Dandy in IR clinic in 2-4 weeks  LOS: 0 days    Rajanee Schuelke,D [facility] 12/24/2012

## 2012-12-24 NOTE — ED Notes (Signed)
5Fr sheath removed from R femoral artery by Dr. Lowella Dandy.  Hemostasis achieved using Exoseal device.  R groin level 0, 2+RDP.  Manual pressure applied X5 mins.  Gauze tegaderm drsg applied.

## 2012-12-24 NOTE — Progress Notes (Signed)
Pt received to 1504 by Oletha Cruel from IR staff Loraine Leriche RN, Olegario Messier).  Unable to admit patient to 5E.  Admitting called; pt placement called by NS.  Admitting cont to work on getting patient admitted to 5E.  RN unable to see orders for patient at current time.  Pt pain level increasing.  IR called @ 1250 to request staff to come and give IV pain meds to patient.  No one available to come to 5E and give meds.

## 2012-12-24 NOTE — ED Notes (Signed)
Call report attempted.

## 2012-12-24 NOTE — Progress Notes (Signed)
Dr Lowella Dandy to unit to check on patient.  Dilaudid PCA started per Dr Lowella Dandy verbal orders.  Bolus from PCA given. See MAR.

## 2012-12-24 NOTE — H&P (Signed)
Chief Complaint: "I am here for a fibroid embolization." HPI: Carolyn David is an 42 y.o. female with PMHx of breast fibroadenoma, anemia, and uterine fibroid, discovered approximately 1 year ago. Patient c/o menorrhagia with menstrual cycle lasting 7-9 days, urinary frequency and incontinence. Patient was seen in consult by Dr. Lowella Dandy 05/14, followed by MRI 06/14. Patient is here today for uterine fibroid embolization. She denies any chest pain or shortness of breath. She denies any active bleeding. She denies any recent illness, fever or chills.   Past Medical History:  Past Medical History  Diagnosis Date  . Breast lump     left  . Breast pain     left  . Asthma   . Anxiety   . Depression   . Fibroids     Past Surgical History:  Past Surgical History  Procedure Laterality Date  . Breast fibroadenoma surgery      left  . Laparoscopic endometriosis fulguration      negative result  . Knee arthroscopy      right    Family History: History reviewed. No pertinent family history.  Social History:  reports that she has never smoked. She has never used smokeless tobacco. She reports that  drinks alcohol. She reports that she does not use illicit drugs.   Allergies  Allergen Reactions  . Erythromycin Nausea And Vomiting      Medication List    ASK your doctor about these medications       albuterol 108 (90 BASE) MCG/ACT inhaler  Commonly known as:  PROVENTIL HFA;VENTOLIN HFA  Inhale 2 puffs into the lungs every 6 (six) hours as needed for wheezing.     norethindrone-ethinyl estradiol 0.5/0.75/1-35 MG-MCG tablet  Commonly known as:  TRIPHASIL,CYCLAFEM,ALYACEN  Take 1 tablet by mouth daily.     PARoxetine 20 MG tablet  Commonly known as:  PAXIL  Take 20 mg by mouth every evening.     ZYRTEC ALLERGY PO  Take 10 mg by mouth daily.        Please HPI for pertinent positives, otherwise complete 10 system ROS negative.  Physical Exam: BP 118/69  Pulse 70  Temp(Src) 98  F (36.7 C) (Oral)  Resp 18  Ht 5\' 1"  (1.549 m)  Wt 140 lb (63.504 kg)  BMI 26.47 kg/m2  SpO2 97% Body mass index is 26.47 kg/(m^2).   General Appearance:  Alert, cooperative, no distress, appears stated age  Head:  Normocephalic, without obvious abnormality, atraumatic  Lungs:   Clear to auscultation bilaterally, no w/r/r, respirations unlabored without use of accessory muscles.  Chest Wall:  No tenderness or deformity  Heart:  Regular rate and rhythm, S1, S2 normal, no murmur, rub or gallop.  Abdomen:   Soft, non-tender, non distended.  Extremities: Extremities normal, atraumatic, no cyanosis or edema  Pulses: 2+ and symmetric  Neurologic: Normal affect, no gross deficits.   Results for orders placed during the hospital encounter of 12/24/12 (from the past 48 hour(s))  APTT     Status: None   Collection Time    12/24/12  7:55 AM      Result Value Range   aPTT 26  24 - 37 seconds  BASIC METABOLIC PANEL     Status: Abnormal   Collection Time    12/24/12  7:55 AM      Result Value Range   Sodium 134 (*) 135 - 145 mEq/L   Potassium 3.7  3.5 - 5.1 mEq/L   Chloride 100  96 - 112 mEq/L   CO2 24  19 - 32 mEq/L   Glucose, Bld 83  70 - 99 mg/dL   BUN 9  6 - 23 mg/dL   Creatinine, Ser 1.61  0.50 - 1.10 mg/dL   Calcium 8.6  8.4 - 09.6 mg/dL   GFR calc non Af Amer >90  >90 mL/min   GFR calc Af Amer >90  >90 mL/min   Comment: (NOTE)     The eGFR has been calculated using the CKD EPI equation.     This calculation has not been validated in all clinical situations.     eGFR's persistently <90 mL/min signify possible Chronic Kidney     Disease.  CBC     Status: Abnormal   Collection Time    12/24/12  7:55 AM      Result Value Range   WBC 4.8  4.0 - 10.5 K/uL   RBC 4.71  3.87 - 5.11 MIL/uL   Hemoglobin 10.8 (*) 12.0 - 15.0 g/dL   HCT 04.5 (*) 40.9 - 81.1 %   MCV 72.2 (*) 78.0 - 100.0 fL   MCH 22.9 (*) 26.0 - 34.0 pg   MCHC 31.8  30.0 - 36.0 g/dL   RDW 91.4 (*) 78.2 - 95.6 %    Platelets 252  150 - 400 K/uL  HCG, SERUM, QUALITATIVE     Status: None   Collection Time    12/24/12  7:55 AM      Result Value Range   Preg, Serum NEGATIVE  NEGATIVE   Comment:            THE SENSITIVITY OF THIS     METHODOLOGY IS >10 mIU/mL.  PROTIME-INR     Status: None   Collection Time    12/24/12  7:55 AM      Result Value Range   Prothrombin Time 12.3  11.6 - 15.2 seconds   INR 0.93  0.00 - 1.49   No results found.  Assessment/Plan Uterine fibroid with urinary frequency, incontinence, and menorrhagia.  Anemia. Consult with Dr. Lowella Dandy 08/2012. MRI 09/2012. Scheduled for uterine fibroid embolization. Labs received, patient has been NPO. Risks and Benefits discussed with the patient. All of the patient's questions were answered, patient is agreeable to proceed. Consent signed and in chart.   [name] D PA-C 12/24/2012, 8:48 AM

## 2012-12-24 NOTE — Progress Notes (Signed)
Stacey RN IR to 1504 and administered Fentanyl IVP per Dr Lowella Dandy orders.  Pt evaluated by Dr Lowella Dandy.  Pain beginning to decrease in intensity per patient.

## 2012-12-24 NOTE — Progress Notes (Signed)
Pt successfully admitted to 5E 1504.

## 2012-12-25 ENCOUNTER — Other Ambulatory Visit: Payer: Self-pay | Admitting: Radiology

## 2012-12-25 DIAGNOSIS — D219 Benign neoplasm of connective and other soft tissue, unspecified: Secondary | ICD-10-CM | POA: Diagnosis present

## 2012-12-25 MED ORDER — HYDROCODONE-ACETAMINOPHEN 5-325 MG PO TABS: 1.0000 | ORAL_TABLET | ORAL | Status: DC | PRN

## 2012-12-25 MED ORDER — DSS 100 MG PO CAPS: 100.0000 mg | ORAL_CAPSULE | Freq: Two times a day (BID) | ORAL | Status: DC

## 2012-12-25 MED ORDER — IBUPROFEN 600 MG PO TABS: 600.0000 mg | ORAL_TABLET | Freq: Three times a day (TID) | ORAL | Status: DC

## 2012-12-25 MED ORDER — PROMETHAZINE HCL 12.5 MG PO TABS: 12.5000 mg | ORAL_TABLET | Freq: Four times a day (QID) | ORAL | Status: DC | PRN

## 2012-12-25 MED ORDER — HYDROCODONE-ACETAMINOPHEN 5-325 MG PO TABS
1.0000 | ORAL_TABLET | ORAL | Status: DC | PRN
  Administered 2012-12-25: 2 via ORAL
  Filled 2012-12-25: qty 2

## 2012-12-25 NOTE — Progress Notes (Signed)
Discharge instructions given to pt, verbalized understanding. Left the unit in stable condition. 

## 2012-12-25 NOTE — Care Management Note (Signed)
    Page 1 of 1   12/25/2012     10:30:39 AM   CARE MANAGEMENT NOTE 12/25/2012  Patient:  Carolyn David, Carolyn David   Account Number:  [id number]  Date Initiated:  12/25/2012  Documentation initiated by:  Lorenda Ishihara  Subjective/Objective Assessment:   42 yo female admitted s/p uterine embolization. PTA lived at home     Action/Plan:   Home when stable   Anticipated DC Date:  12/25/2012   Anticipated DC Plan:  HOME/SELF CARE      DC Planning Services  CM consult      Choice offered to / List presented to:             Status of service:  Completed, signed off Medicare Important Message given?   (If response is "NO", the following Medicare IM given date fields will be blank) Date Medicare IM given:   Date Additional Medicare IM given:    Discharge Disposition:  HOME/SELF CARE  Per UR Regulation:  Reviewed for med. necessity/level of care/duration of stay  If discussed at Long Length of Stay Meetings, dates discussed:    Comments:

## 2012-12-25 NOTE — Discharge Summary (Signed)
Physician Discharge Summary  Patient ID: Carolyn David MRN: [id number] DOB/AGE: Apr 19, 1971 42 y.o.  Admit date: 12/24/2012 Discharge date: 12/25/2012  Admission Diagnoses: Principal Problem:   Fibroids  Discharge Diagnoses:  Principal Problem:   Fibroids    Procedures: Uterine Artery Embolization 12/24/2012  Discharged Condition: good  Hospital Course: HPI: Carolyn David is an 42 y.o. female with PMHx of breast fibroadenoma, anemia, and uterine fibroid, discovered approximately 1 year ago. Patient c/o menorrhagia with menstrual cycle lasting 7-9 days, urinary frequency and incontinence. Patient was seen in consult by Dr. Lowella Dandy 05/14, followed by MRI 06/14. Patient is here today for uterine fibroid embolization. She denies any chest pain or shortness of breath. She denies any active bleeding. She denies any recent illness, fever or chills.   Pt taken to IR suite and underwent successful uterine artery embolization via rt femoral artery access. No immediate complications. She was then admitted to floor in stable condition. Overnight had improved pain control. POD#1, minimal discomfort. No N/V, has been tolerating reg diet. Voiding well on own, no foley. She has been up and out of bed as well. Pt is deemed stable for discharge. All medications, instructions, and follow up plans have been reviewed with pt and she understands.    Consults: None   Discharge Exam: Blood pressure 127/75, pulse 67, temperature 98.8 F (37.1 C), temperature source Oral, resp. rate 12, height 5\' 1"  (1.549 m), weight 140 lb (63.504 kg), SpO2 100.00%. Lungs: CTA without w/r/r Heart: Regular Abdomen: soft, NT, active BS Ext: Rt groin soft, dry, NT. No hematoma   Disposition: Home      Discharge Orders   Future Orders Complete By Expires   Call MD for:  persistant nausea and vomiting  As directed    Call MD for:  redness, tenderness, or signs of infection (pain, swelling, redness, odor or green/yellow  discharge around incision site)  As directed    Call MD for:  severe uncontrolled pain  As directed    Call MD for:  temperature >100.4  As directed    Diet general  As directed    Driving Restrictions  As directed    Comments:     Avoid driving for 2-3 days.   Increase activity slowly  As directed    Lifting restrictions  As directed    Comments:     No heavy lifting for one week   May shower / Bathe  As directed    May walk up steps  As directed    No dressing needed  As directed    Comments:     May remove dressing at home. Place Bandaid if necessary       Medication List         albuterol 108 (90 BASE) MCG/ACT inhaler  Commonly known as:  PROVENTIL HFA;VENTOLIN HFA  Inhale 2 puffs into the lungs every 6 (six) hours as needed for wheezing.     DSS 100 MG Caps  Take 100 mg by mouth 2 (two) times daily.     HYDROcodone-acetaminophen 5-325 MG per tablet  Commonly known as:  NORCO/VICODIN  Take 1-2 tablets by mouth every 4 (four) hours as needed for pain.     ibuprofen 600 MG tablet  Commonly known as:  ADVIL,MOTRIN  Take 1 tablet (600 mg total) by mouth 3 (three) times daily after meals.     norethindrone-ethinyl estradiol 0.5/0.75/1-35 MG-MCG tablet  Commonly known as:  TRIPHASIL,CYCLAFEM,ALYACEN  Take 1 tablet by mouth  daily.     PARoxetine 20 MG tablet  Commonly known as:  PAXIL  Take 20 mg by mouth every evening.     promethazine 12.5 MG tablet  Commonly known as:  PHENERGAN  Take 1 tablet (12.5 mg total) by mouth every 6 (six) hours as needed for nausea.     ZYRTEC ALLERGY PO  Take 10 mg by mouth daily.       Follow-up Information   Follow up with Abundio Miu, MD. Schedule an appointment as soon as possible for a visit in 2 weeks. (Office will call)    Specialty:  Interventional Radiology   Contact information:   [address] [place] [address] [phone]       Signed: Brayton El PA-C 12/25/2012, 9:35 AM

## 2013-01-13 ENCOUNTER — Ambulatory Visit
Admission: RE | Admit: 2013-01-13 | Discharge: 2013-01-13 | Disposition: A | Discharge status: Home / Self Care | Payer: BC Managed Care – PPO | Source: Ambulatory Visit | Attending: Radiology | Admitting: Radiology

## 2013-01-13 DIAGNOSIS — D219 Benign neoplasm of connective and other soft tissue, unspecified: Secondary | ICD-10-CM

## 2013-03-04 ENCOUNTER — Other Ambulatory Visit: Payer: Self-pay

## 2013-03-11 ENCOUNTER — Telehealth: Payer: Self-pay | Admitting: Radiology

## 2013-03-11 NOTE — Telephone Encounter (Signed)
Left message on M# requesting patient call for 3 mo status update post [place].  Artez Regis Carmell Austria, RN 03/11/2013 9:48 AM

## 2013-03-12 ENCOUNTER — Telehealth: Payer: Self-pay | Admitting: Emergency Medicine

## 2013-03-12 NOTE — Telephone Encounter (Signed)
Pt called back and LMOVM here in the office to say that  her symptoms have disappeared and everything is great post [place].

## 2013-05-19 ENCOUNTER — Other Ambulatory Visit (HOSPITAL_COMMUNITY): Payer: Self-pay | Admitting: Diagnostic Radiology

## 2013-05-19 DIAGNOSIS — D219 Benign neoplasm of connective and other soft tissue, unspecified: Secondary | ICD-10-CM

## 2013-05-27 ENCOUNTER — Other Ambulatory Visit: Payer: Self-pay | Admitting: Obstetrics and Gynecology

## 2013-05-27 DIAGNOSIS — D219 Benign neoplasm of connective and other soft tissue, unspecified: Secondary | ICD-10-CM

## 2013-07-01 ENCOUNTER — Ambulatory Visit
Admission: RE | Admit: 2013-07-01 | Discharge: 2013-07-01 | Disposition: A | Discharge status: Home / Self Care | Payer: BC Managed Care – PPO | Source: Ambulatory Visit | Attending: Diagnostic Radiology | Admitting: Diagnostic Radiology

## 2013-07-01 ENCOUNTER — Ambulatory Visit
Admission: RE | Admit: 2013-07-01 | Discharge: 2013-07-01 | Disposition: A | Discharge status: Home / Self Care | Payer: BC Managed Care – PPO | Source: Ambulatory Visit | Attending: Obstetrics and Gynecology | Admitting: Obstetrics and Gynecology

## 2013-07-01 DIAGNOSIS — D219 Benign neoplasm of connective and other soft tissue, unspecified: Secondary | ICD-10-CM

## 2013-07-01 MED ORDER — GADOBENATE DIMEGLUMINE 529 MG/ML IV SOLN
13.0000 mL | Freq: Once | INTRAVENOUS | Status: AC | PRN
  Administered 2013-07-01: 13 mL via INTRAVENOUS

## 2014-09-10 ENCOUNTER — Encounter (HOSPITAL_COMMUNITY): Payer: Self-pay | Admitting: Emergency Medicine

## 2014-09-10 ENCOUNTER — Emergency Department (INDEPENDENT_AMBULATORY_CARE_PROVIDER_SITE_OTHER)
Admission: EM | Admit: 2014-09-10 | Discharge: 2014-09-10 | Disposition: A | Discharge status: Home / Self Care | Payer: BLUE CROSS/BLUE SHIELD | Source: Home / Self Care | Attending: Family Medicine | Admitting: Family Medicine

## 2014-09-10 DIAGNOSIS — G43009 Migraine without aura, not intractable, without status migrainosus: Secondary | ICD-10-CM

## 2014-09-10 MED ORDER — DEXAMETHASONE SODIUM PHOSPHATE 10 MG/ML IJ SOLN
INTRAMUSCULAR | Status: AC
  Filled 2014-09-10: qty 1

## 2014-09-10 MED ORDER — DEXAMETHASONE SODIUM PHOSPHATE 10 MG/ML IJ SOLN
10.0000 mg | Freq: Once | INTRAMUSCULAR | Status: AC
  Administered 2014-09-10: 10 mg via INTRAMUSCULAR

## 2014-09-10 MED ORDER — ONDANSETRON 4 MG PO TBDP
4.0000 mg | ORAL_TABLET | Freq: Once | ORAL | Status: AC
  Administered 2014-09-10: 4 mg via ORAL

## 2014-09-10 MED ORDER — DIPHENHYDRAMINE HCL 25 MG PO CAPS
25.0000 mg | ORAL_CAPSULE | Freq: Once | ORAL | Status: AC
  Administered 2014-09-10: 25 mg via ORAL

## 2014-09-10 MED ORDER — ONDANSETRON HCL 4 MG PO TABS: 4.0000 mg | ORAL_TABLET | Freq: Three times a day (TID) | ORAL | Status: DC | PRN

## 2014-09-10 MED ORDER — ONDANSETRON 4 MG PO TBDP
ORAL_TABLET | ORAL | Status: AC
  Filled 2014-09-10: qty 1

## 2014-09-10 MED ORDER — DIPHENHYDRAMINE HCL 25 MG PO CAPS
ORAL_CAPSULE | ORAL | Status: AC
  Filled 2014-09-10: qty 1

## 2014-09-10 NOTE — ED Provider Notes (Signed)
CSN: [id number]     Arrival date & time 09/10/14  1229 History   First MD Initiated Contact with Patient 09/10/14 1332     Chief Complaint  Patient presents with  . Headache   (Consider location/radiation/quality/duration/timing/severity/associated sxs/prior Treatment) HPI  HEadache. Started at 10:30. Came on suddenly. Back of skull and front of head. Throbbing. Laid down and took tylenol. Woke up 38min later and HA is no better. Constant and getting a little worse. H/o migraines w/ aura but states this one is different. Occur infrequently. Photophobia. Movement makes it worse. Nausea.. Hot and cold spells.   Denies fevers, vomiting, diarrhea, constipation, rash, change in mental status, unilateral weakness, dysuria, frequency, abdominal pain, back pain.  Past Medical History  Diagnosis Date  . Breast lump     left  . Breast pain     left  . Asthma   . Anxiety   . Depression   . Fibroids   . PONV (postoperative nausea and vomiting)    Past Surgical History  Procedure Laterality Date  . Breast fibroadenoma surgery      left  . Laparoscopic endometriosis fulguration      negative result  . Knee arthroscopy      right   Family History  Problem Relation Age of Onset  . Asthma Mother   . Ectodermal dysplasia Mother   . Asthma Father   . Asthma Sister   . Asthma Brother    History  Substance Use Topics  . Smoking status: Never Smoker   . Smokeless tobacco: Never Used  . Alcohol Use: Yes     Comment: rarely has one drink/week   OB History    No data available     Review of Systems Per HPI with all other pertinent systems negative.   Allergies  Latex and Erythromycin  Home Medications   Prior to Admission medications   Medication Sig Start Date End Date Taking? Authorizing Provider  fluticasone (FLONASE) 50 MCG/ACT nasal spray Place into both nostrils daily.   Yes Historical Provider, MD  norethindrone-ethinyl estradiol (TRIPHASIL,CYCLAFEM,ALYACEN)  0.5/0.75/1-35 MG-MCG tablet Take 1 tablet by mouth daily.   Yes Historical Provider, MD  PARoxetine (PAXIL) 20 MG tablet Take 20 mg by mouth every evening.   Yes Historical Provider, MD  albuterol (PROVENTIL HFA;VENTOLIN HFA) 108 (90 BASE) MCG/ACT inhaler Inhale 2 puffs into the lungs every 6 (six) hours as needed for wheezing.    Historical Provider, MD  ondansetron (ZOFRAN) 4 MG tablet Take 1 tablet (4 mg total) by mouth every 8 (eight) hours as needed for nausea or vomiting. 09/10/14   Waldemar Dickens, MD   BP 138/77 mmHg  Pulse 72  Temp(Src) 97.6 F (36.4 C) (Oral)  Resp 16  SpO2 99%  LMP 09/10/2014 Physical Exam Physical Exam  Constitutional: oriented to person, place, and time. appears well-developed and well-nourished. No distress.  HENT:  Head: Normocephalic and atraumatic.  Eyes: EOMI. PERRL.  Neck: Normal range of motion.  Cardiovascular: RRR, no m/r/g, 2+ distal pulses,  Pulmonary/Chest: Effort normal and breath sounds normal. No respiratory distress.  Abdominal: Soft. Bowel sounds are normal. NonTTP, no distension.  Musculoskeletal: Normal range of motion. Non ttp, no effusion.  Neurological: Cranial nerves II through XII intact, no dysmetria, rapid alternating movement normal, who is also recent coronary fashion,  limited ophthalmic examination without marked evidence of optic nerve cupping. Skin: Skin is warm. No rash noted. non diaphoretic.  Psychiatric: normal mood and affect. behavior is normal.  Judgment and thought content normal.   ED Course  Procedures (including critical care time) Labs Review Labs Reviewed - No data to display  Imaging Review No results found.   MDM   1. Migraine without aura and without status migrainosus, not intractable    Decadron 10 mg IM, Zofran 4 mg ODT, Benadryl 25 mg by mouth administered. Patient monitored for 15 minutes. Patient given additional protection for Zofran at home. Very strict return precautions  discussed.    Waldemar Dickens, MD 09/10/14 [phone]

## 2014-09-10 NOTE — ED Notes (Signed)
Reports "an abrupt headache" onset 1030 today; hx of migraine HA Headache increases w/movement and bright lights  Took 2 tyle w/no relief Alert, no signs of acute distress.

## 2014-09-10 NOTE — Discharge Instructions (Signed)
Your symptoms are likely due to a migraine. Your given Decadron, Zofran, and Benadryl to help relieve her symptoms. Please go home and rest today. Use additional Zofran as needed for nausea. Please continue using Tylenol 1000 mg every 8 hours and consider using a one-time dose of aspirin 975 mg. Please here to the emergency room if he gets significantly worse.

## 2014-09-14 ENCOUNTER — Other Ambulatory Visit: Payer: Self-pay | Admitting: Family Medicine

## 2014-09-14 DIAGNOSIS — E041 Nontoxic single thyroid nodule: Secondary | ICD-10-CM

## 2014-09-16 ENCOUNTER — Ambulatory Visit
Admission: RE | Admit: 2014-09-16 | Discharge: 2014-09-16 | Disposition: A | Discharge status: Home / Self Care | Payer: BLUE CROSS/BLUE SHIELD | Source: Ambulatory Visit | Attending: Family Medicine | Admitting: Family Medicine

## 2014-09-16 DIAGNOSIS — E041 Nontoxic single thyroid nodule: Secondary | ICD-10-CM

## 2014-09-21 ENCOUNTER — Other Ambulatory Visit: Payer: Self-pay | Admitting: Family Medicine

## 2014-09-21 DIAGNOSIS — E041 Nontoxic single thyroid nodule: Secondary | ICD-10-CM

## 2014-09-29 ENCOUNTER — Other Ambulatory Visit (HOSPITAL_COMMUNITY)
Admission: RE | Admit: 2014-09-29 | Discharge: 2014-09-29 | Disposition: A | Discharge status: Home / Self Care | Payer: BLUE CROSS/BLUE SHIELD | Source: Ambulatory Visit | Attending: Diagnostic Radiology | Admitting: Diagnostic Radiology

## 2014-09-29 ENCOUNTER — Ambulatory Visit
Admission: RE | Admit: 2014-09-29 | Discharge: 2014-09-29 | Disposition: A | Discharge status: Home / Self Care | Payer: BLUE CROSS/BLUE SHIELD | Source: Ambulatory Visit | Attending: Family Medicine | Admitting: Family Medicine

## 2014-09-29 DIAGNOSIS — E041 Nontoxic single thyroid nodule: Secondary | ICD-10-CM | POA: Diagnosis present

## 2014-10-12 ENCOUNTER — Other Ambulatory Visit: Payer: BLUE CROSS/BLUE SHIELD

## 2015-02-01 ENCOUNTER — Other Ambulatory Visit: Payer: Self-pay | Admitting: Family Medicine

## 2015-02-01 ENCOUNTER — Other Ambulatory Visit: Payer: Self-pay

## 2015-02-01 ENCOUNTER — Other Ambulatory Visit (HOSPITAL_COMMUNITY)
Admission: RE | Admit: 2015-02-01 | Discharge: 2015-02-01 | Disposition: A | Discharge status: Home / Self Care | Payer: BLUE CROSS/BLUE SHIELD | Source: Ambulatory Visit | Attending: Family Medicine | Admitting: Family Medicine

## 2015-02-01 DIAGNOSIS — Z1231 Encounter for screening mammogram for malignant neoplasm of breast: Secondary | ICD-10-CM

## 2015-02-01 DIAGNOSIS — N6320 Unspecified lump in the left breast, unspecified quadrant: Secondary | ICD-10-CM

## 2015-02-01 DIAGNOSIS — Z01419 Encounter for gynecological examination (general) (routine) without abnormal findings: Secondary | ICD-10-CM | POA: Diagnosis not present

## 2015-02-02 LAB — CYTOLOGY - PAP

## 2015-02-08 ENCOUNTER — Other Ambulatory Visit: Payer: BLUE CROSS/BLUE SHIELD

## 2015-02-08 ENCOUNTER — Ambulatory Visit: Payer: BLUE CROSS/BLUE SHIELD

## 2015-02-08 ENCOUNTER — Ambulatory Visit
Admission: RE | Admit: 2015-02-08 | Discharge: 2015-02-08 | Disposition: A | Discharge status: Home / Self Care | Payer: BLUE CROSS/BLUE SHIELD | Source: Ambulatory Visit | Attending: Family Medicine | Admitting: Family Medicine

## 2015-02-08 DIAGNOSIS — Z1231 Encounter for screening mammogram for malignant neoplasm of breast: Secondary | ICD-10-CM

## 2015-09-28 ENCOUNTER — Other Ambulatory Visit: Payer: Self-pay | Admitting: Family Medicine

## 2015-09-28 DIAGNOSIS — E041 Nontoxic single thyroid nodule: Secondary | ICD-10-CM

## 2015-10-09 ENCOUNTER — Other Ambulatory Visit: Payer: BLUE CROSS/BLUE SHIELD

## 2015-12-20 ENCOUNTER — Ambulatory Visit
Admission: RE | Admit: 2015-12-20 | Discharge: 2015-12-20 | Disposition: A | Discharge status: Home / Self Care | Payer: Self-pay | Source: Ambulatory Visit | Attending: Family Medicine | Admitting: Family Medicine

## 2015-12-20 DIAGNOSIS — E041 Nontoxic single thyroid nodule: Secondary | ICD-10-CM

## 2016-02-17 IMAGING — US US SOFT TISSUE HEAD/NECK
1 series · 14 of 25 positions shown · non-contrast
Comparison: None.

CLINICAL DATA: Right thyroid nodule

EXAM:
THYROID ULTRASOUND
TECHNIQUE: Ultrasound examination of the thyroid gland and adjacent soft
tissues was performed.

[Series 1: us soft tissue head/neck · 0.08mm/px · 14 of 50 slices shown]
[im 1/50]
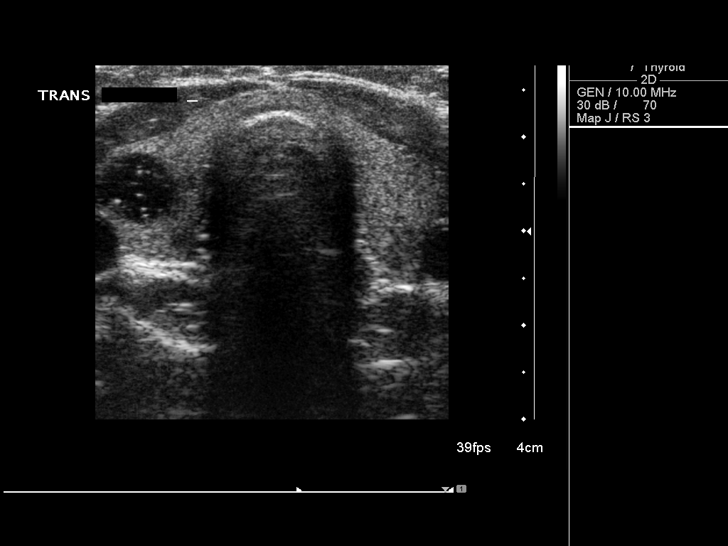
[im 5/50]
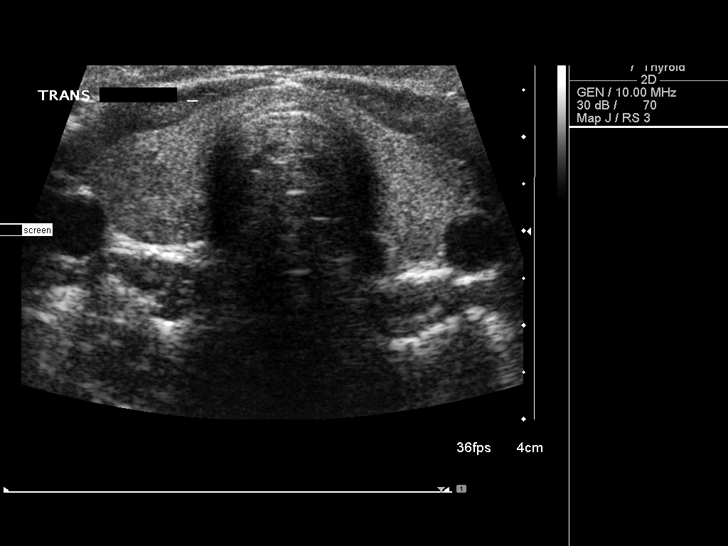
[im 9/50]
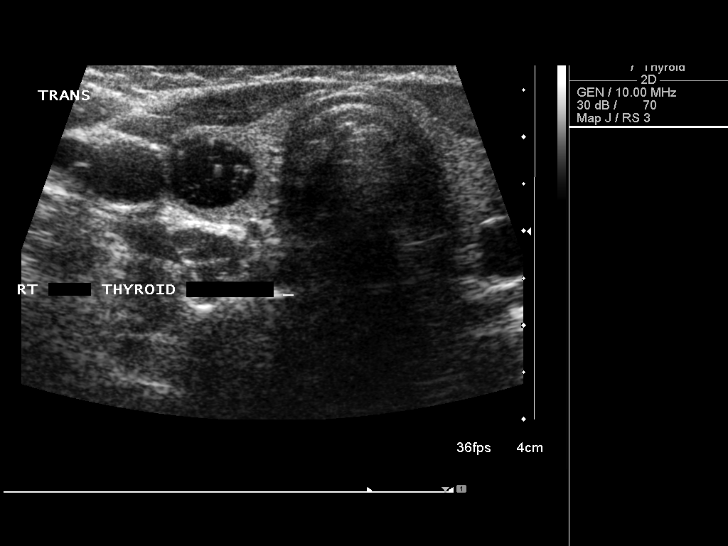
[im 13/50]
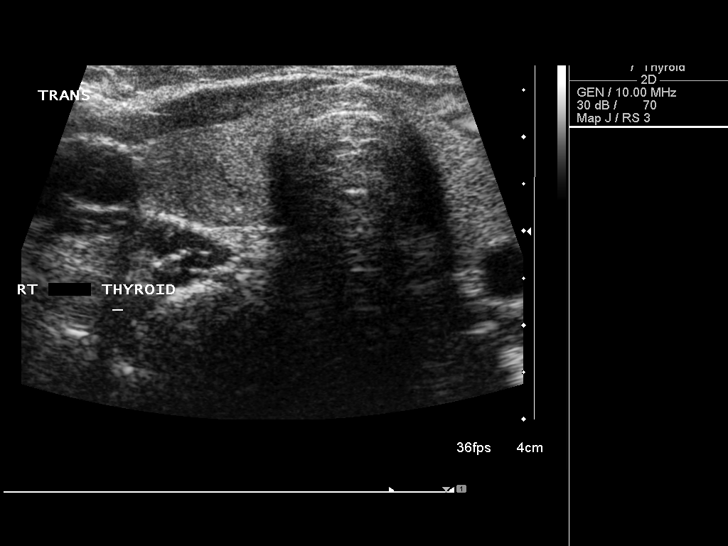
[im 17/50]
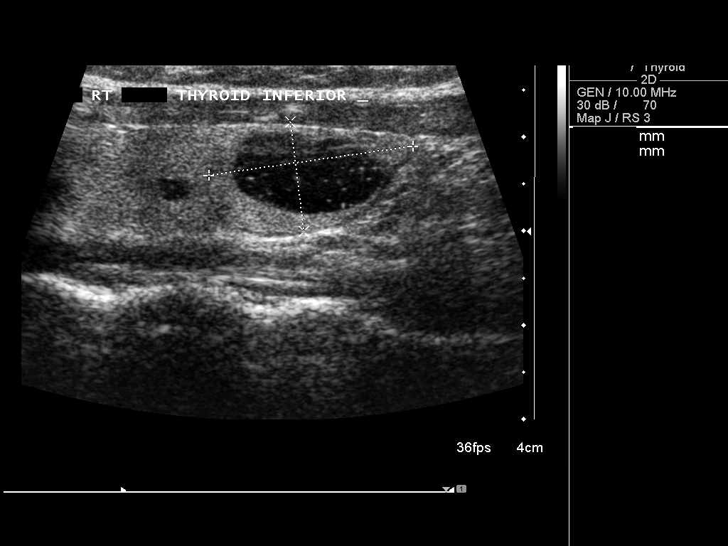
[im 19/50]
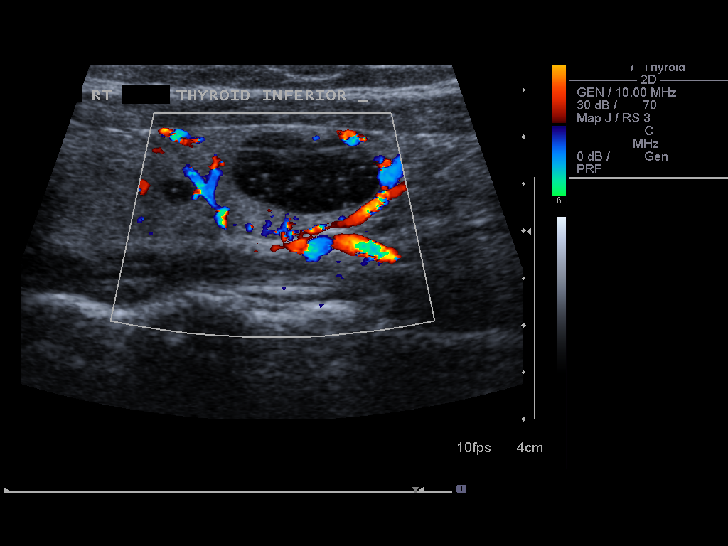
[im 23/50]
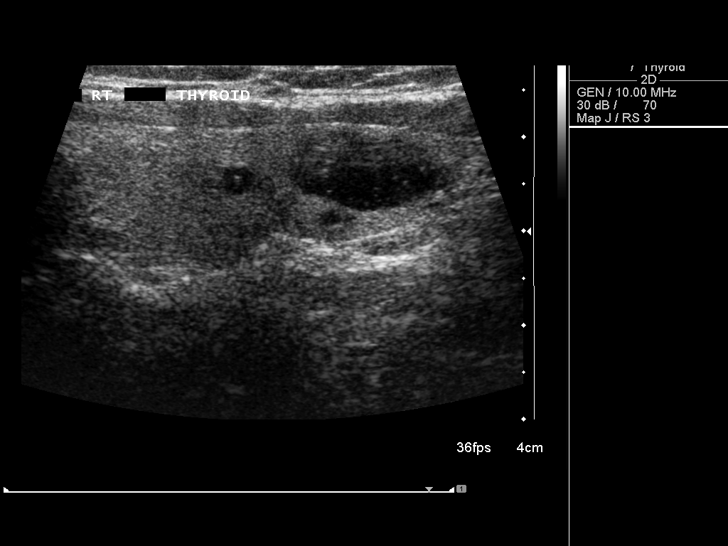
[im 27/50]
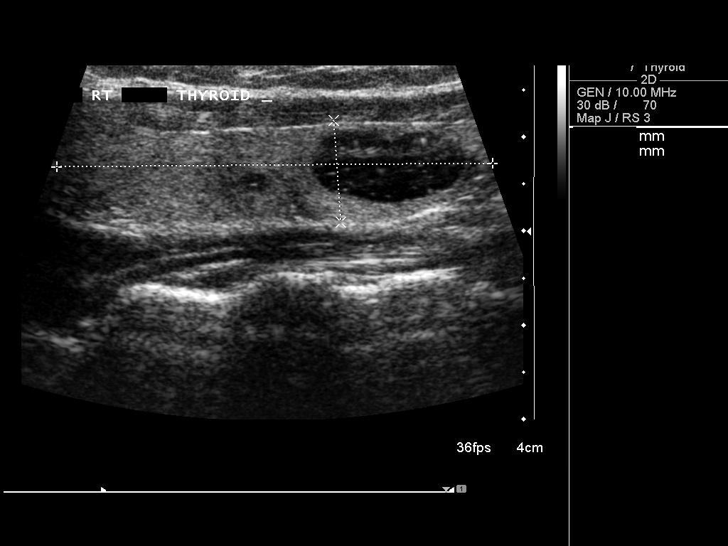
[im 31/50]
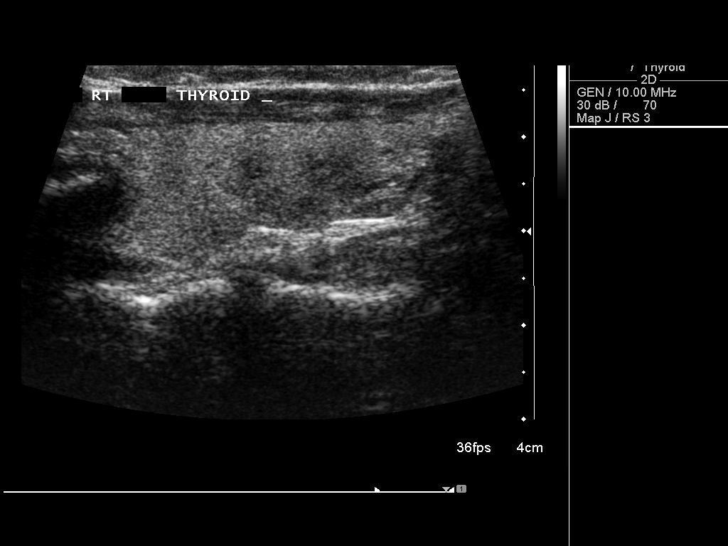
[im 33/50]
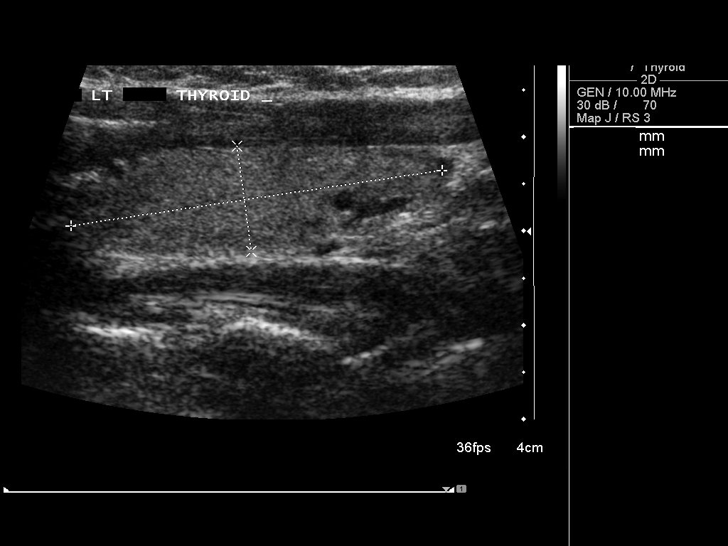
[im 37/50]
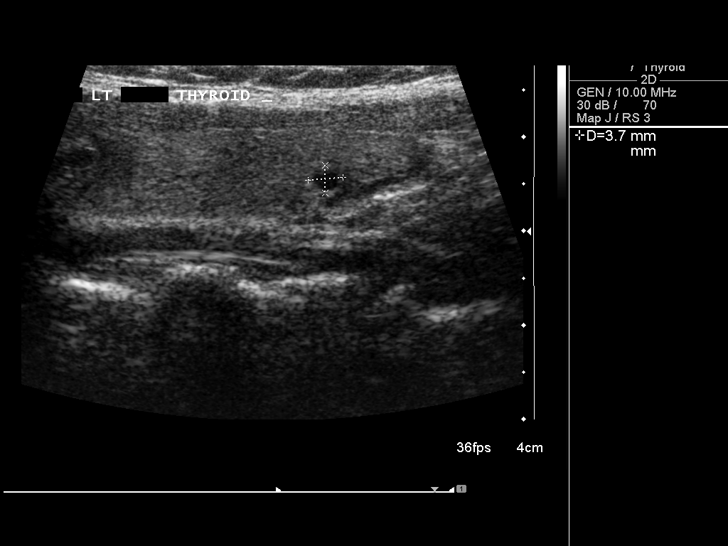
[im 41/50]
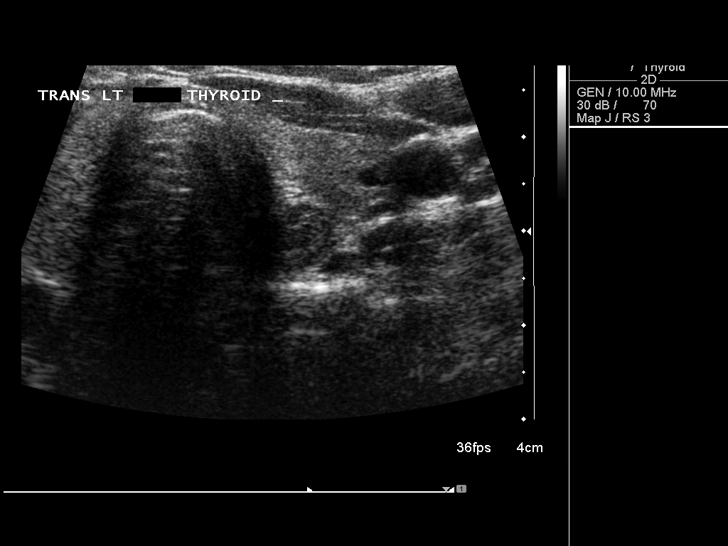
[im 45/50]
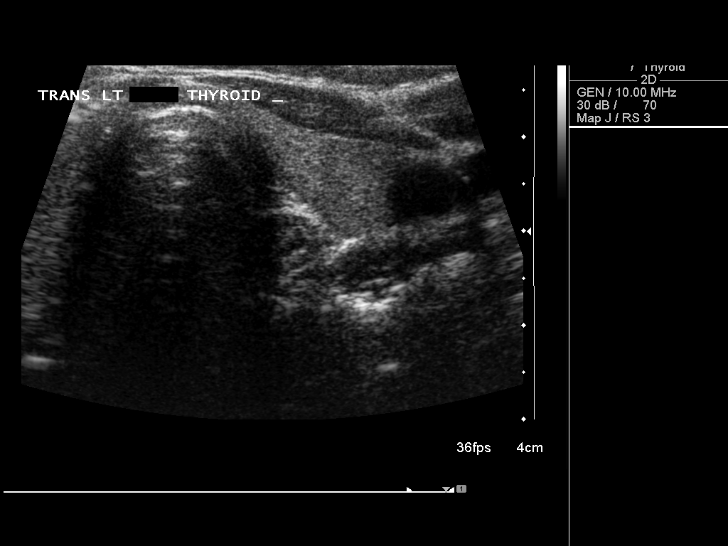
[im 50/50]
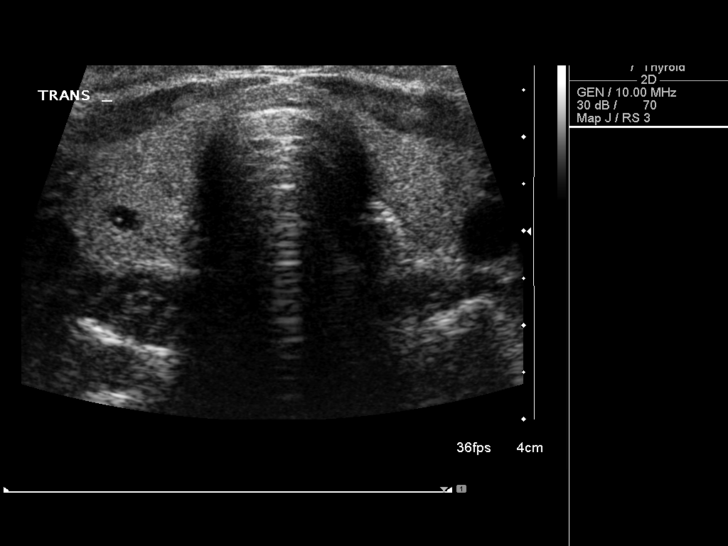

[14 of 25 positions shown; findings below may reference images not displayed]

FINDINGS: Right thyroid lobe

Measurements: 46 x 11 x 13 mm. Fairly homogeneous background
echotexture. Dominant 21 x 12 x 13 mm complex nodule, inferior pole.
4 mm complex cyst, mid lobe.

Left thyroid lobe

Measurements: 39 x 11 x 13 mm.  4 x 3 mm cyst, inferior pole.

Isthmus

Thickness: 3 mm.  No nodules visualized.

Lymphadenopathy

None visualized.
IMPRESSION: 1. Normal-sized thyroid with a dominant 2.1 cm complex right nodule.
Findings meet consensus criteria for biopsy. Ultrasound-guided fine
needle aspiration should be considered, as per the consensus
statement: Management of Thyroid Nodules Detected at US: Society of
Radiologists in Ultrasound Consensus Conference Statement. Radiology

## 2016-03-01 IMAGING — US US THYROID BIOPSY
1 series · 10 of 10 positions shown · non-contrast
Comparison: 09/16/2014

CLINICAL DATA: Dominant right thyroid nodule.

EXAM:
ULTRASOUND GUIDED NEEDLE ASPIRATE BIOPSY OF THE THYROID GLAND

[Series 1: us thyroid biopsy · 0.06mm/px · 10 acquisitions, 10 frames shown]
[im 1/10]
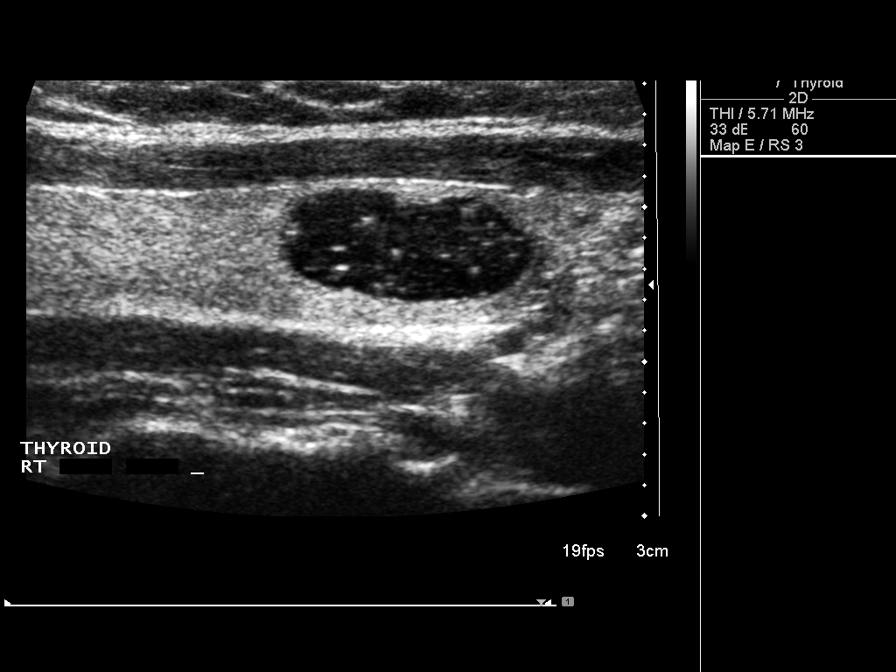
[im 2/10]
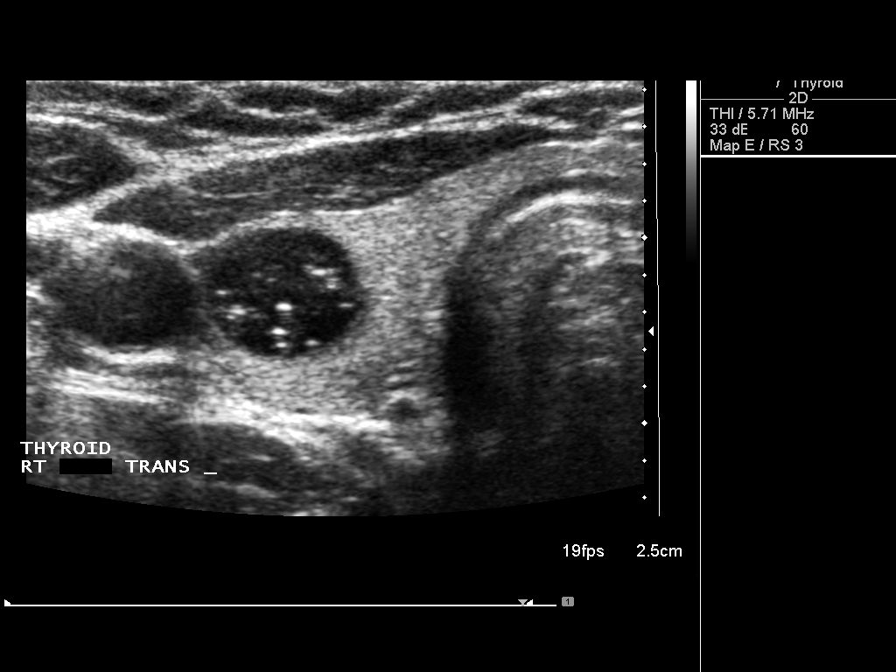
[im 3/10]
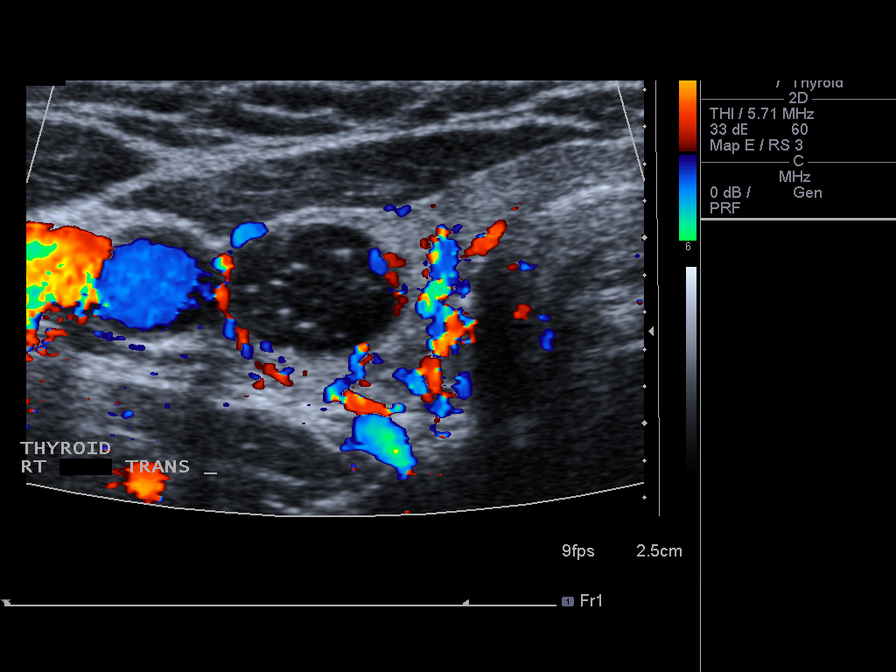
[im 4/10]
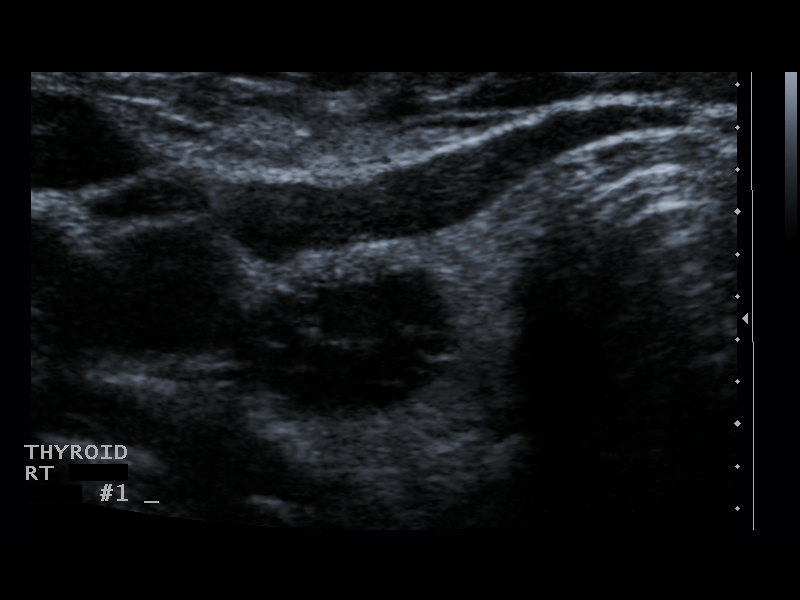
[im 5/10]
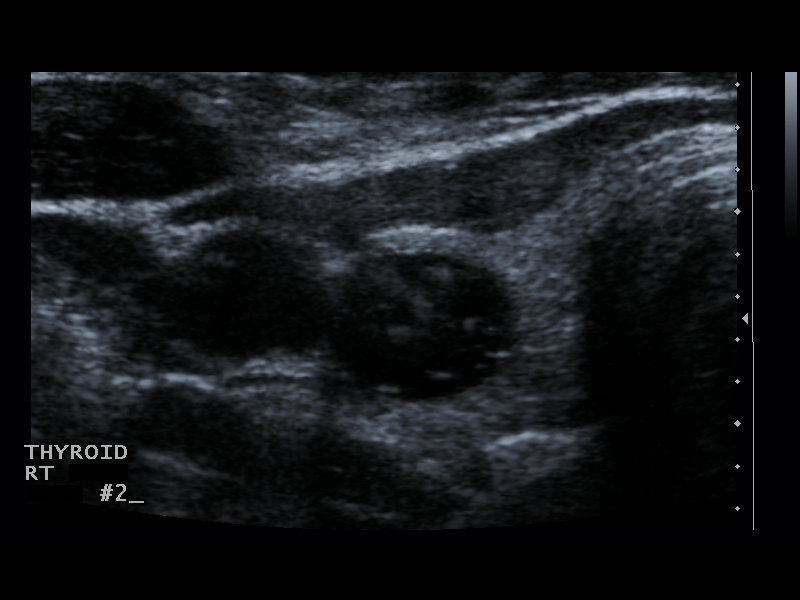
[im 6/10]
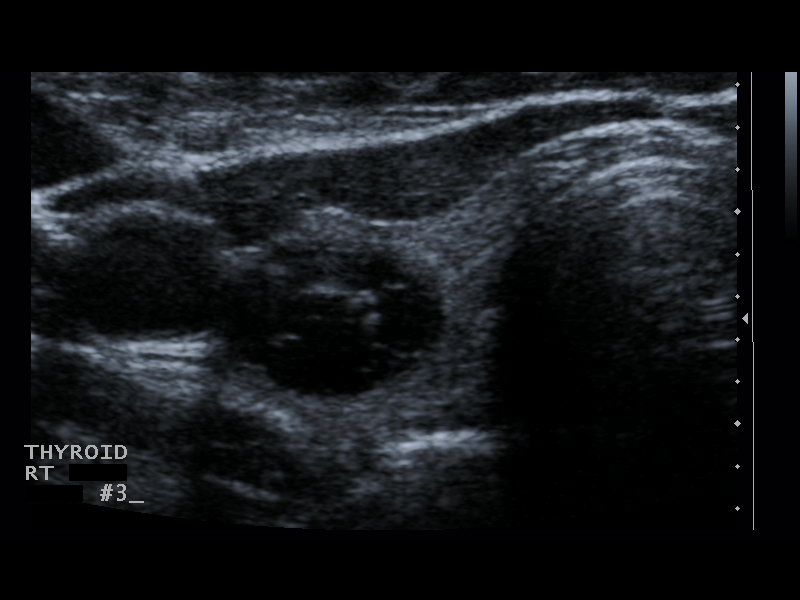
[im 7/10]
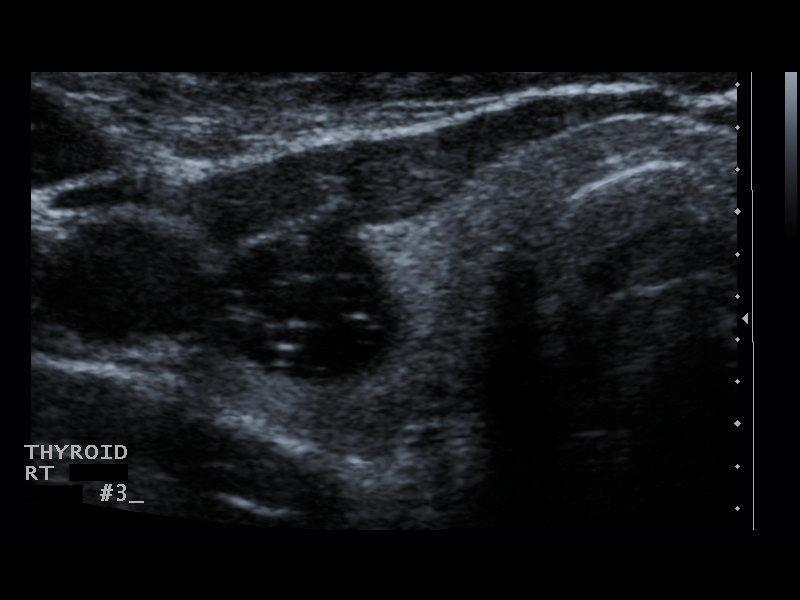
[im 8/10]
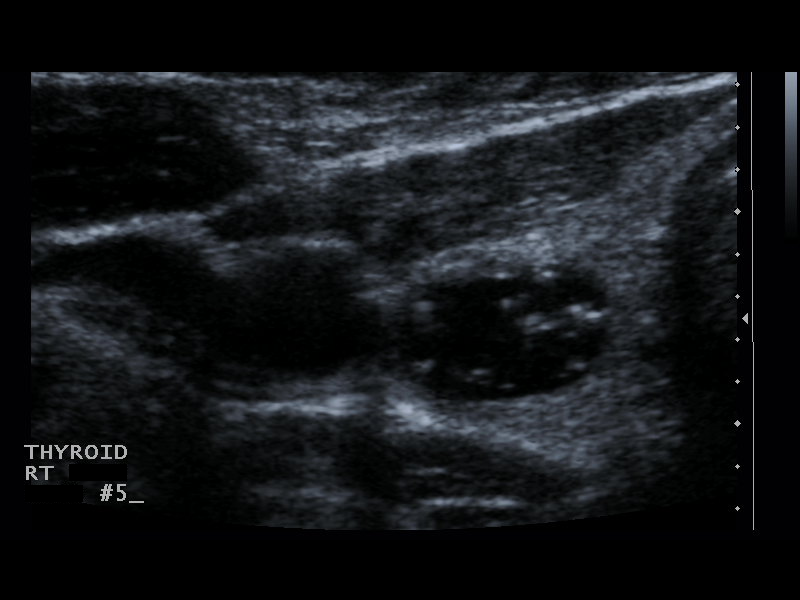
[im 9/10]
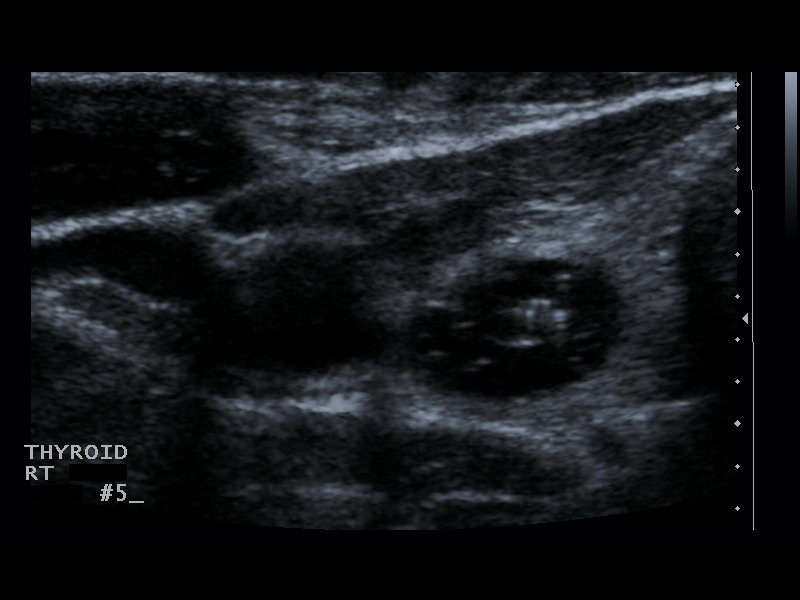
[im 10/10]
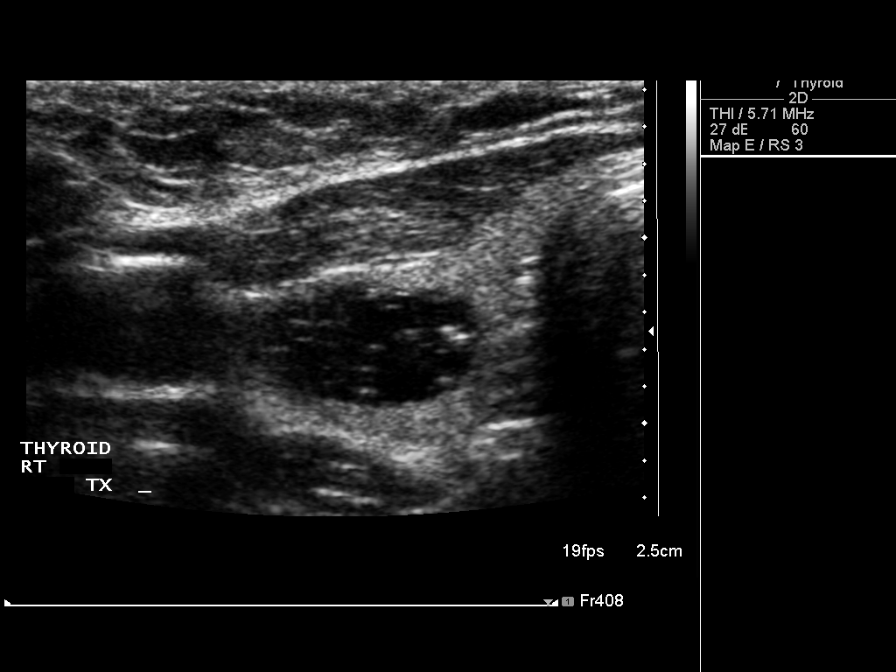

[10 of 10 positions shown; findings below may reference images not displayed]

PROCEDURE:
Thyroid biopsy was thoroughly discussed with the patient and
questions were answered. The benefits, risks, alternatives, and
complications were also discussed. The patient understands and
wishes to proceed with the procedure. Written consent was obtained.

Ultrasound was performed to localize and mark an adequate site for
the biopsy. The patient was then prepped and draped in a normal
sterile fashion. Local anesthesia was provided with 1% lidocaine.
Using direct ultrasound guidance, 5 passes were made using needles
into the nodule within the right lobe of the thyroid. Ultrasound was
used to confirm needle placements on all occasions. Specimens were
sent to Pathology for analysis.

Complications:  None

Estimated blood loss: Minimal
FINDINGS: Hypoechoic nodule in the inferior right thyroid lobe with multiple
echogenic foci. The echogenic foci likely represent inspissated
colloid.
IMPRESSION: Ultrasound guided needle aspirate biopsy performed of the dominant
right thyroid nodule.

## 2016-07-21 ENCOUNTER — Emergency Department (HOSPITAL_COMMUNITY): Payer: 59

## 2016-07-21 ENCOUNTER — Emergency Department (HOSPITAL_COMMUNITY)
Admission: EM | Admit: 2016-07-21 | Discharge: 2016-07-21 | Disposition: A | Discharge status: Home / Self Care | Payer: 59 | Attending: Emergency Medicine | Admitting: Emergency Medicine

## 2016-07-21 ENCOUNTER — Encounter (HOSPITAL_COMMUNITY): Payer: Self-pay | Admitting: Emergency Medicine

## 2016-07-21 DIAGNOSIS — Z91040 Latex allergy status: Secondary | ICD-10-CM | POA: Diagnosis not present

## 2016-07-21 DIAGNOSIS — J45909 Unspecified asthma, uncomplicated: Secondary | ICD-10-CM | POA: Diagnosis not present

## 2016-07-21 DIAGNOSIS — R1033 Periumbilical pain: Secondary | ICD-10-CM | POA: Insufficient documentation

## 2016-07-21 LAB — URINALYSIS, ROUTINE W REFLEX MICROSCOPIC
BILIRUBIN URINE: NEGATIVE
GLUCOSE, UA: NEGATIVE mg/dL
Ketones, ur: 5 mg/dL — AB
Nitrite: NEGATIVE
Protein, ur: 30 mg/dL — AB
SPECIFIC GRAVITY, URINE: 1.025 (ref 1.005–1.030)
pH: 6 (ref 5.0–8.0)

## 2016-07-21 LAB — COMPREHENSIVE METABOLIC PANEL
ALT: 12 U/L — AB (ref 14–54)
AST: 25 U/L (ref 15–41)
Albumin: 3.6 g/dL (ref 3.5–5.0)
Alkaline Phosphatase: 38 U/L (ref 38–126)
Anion gap: 11 (ref 5–15)
BUN: 11 mg/dL (ref 6–20)
CALCIUM: 8.8 mg/dL — AB (ref 8.9–10.3)
CO2: 18 mmol/L — ABNORMAL LOW (ref 22–32)
Chloride: 108 mmol/L (ref 101–111)
Creatinine, Ser: 0.84 mg/dL (ref 0.44–1.00)
GFR calc Af Amer: >60 mL/min (ref >60)
GFR calc non Af Amer: >60 mL/min (ref >60)
Glucose, Bld: 104 mg/dL — ABNORMAL HIGH (ref 65–99)
Potassium: 3.6 mmol/L (ref 3.5–5.1)
SODIUM: 137 mmol/L (ref 135–145)
TOTAL PROTEIN: 7.2 g/dL (ref 6.5–8.1)
Total Bilirubin: 0.5 mg/dL (ref 0.3–1.2)

## 2016-07-21 LAB — CBC
HEMATOCRIT: 38.3 % (ref 36.0–46.0)
HEMOGLOBIN: 12.6 g/dL (ref 12.0–15.0)
MCH: 25.3 pg — ABNORMAL LOW (ref 26.0–34.0)
MCHC: 32.9 g/dL (ref 30.0–36.0)
MCV: 76.8 fL — ABNORMAL LOW (ref 78.0–100.0)
Platelets: 283 10*3/uL (ref 150–400)
RBC: 4.99 MIL/uL (ref 3.87–5.11)
RDW: 17 % — AB (ref 11.5–15.5)
WBC: 7.6 10*3/uL (ref 4.0–10.5)

## 2016-07-21 LAB — LIPASE, BLOOD: Lipase: 30 U/L (ref 11–51)

## 2016-07-21 MED ORDER — IOPAMIDOL (ISOVUE-300) INJECTION 61%
INTRAVENOUS | Status: AC
  Administered 2016-07-21: 100 mL
  Filled 2016-07-21: qty 100

## 2016-07-21 MED ORDER — ONDANSETRON HCL 4 MG/2ML IJ SOLN
4.0000 mg | Freq: Once | INTRAMUSCULAR | Status: AC
  Administered 2016-07-21: 4 mg via INTRAVENOUS
  Filled 2016-07-21: qty 2

## 2016-07-21 MED ORDER — HYDROMORPHONE HCL 1 MG/ML IJ SOLN
0.5000 mg | Freq: Once | INTRAMUSCULAR | Status: AC
  Administered 2016-07-21: 0.5 mg via INTRAVENOUS
  Filled 2016-07-21: qty 0.5

## 2016-07-21 MED ORDER — HYOSCYAMINE SULFATE SL 0.125 MG SL SUBL: 0.1250 mg | SUBLINGUAL_TABLET | SUBLINGUAL | 0 refills | Status: DC | PRN

## 2016-07-21 MED ORDER — ONDANSETRON HCL 4 MG PO TABS: 4.0000 mg | ORAL_TABLET | Freq: Three times a day (TID) | ORAL | 0 refills | Status: DC | PRN

## 2016-07-21 MED ORDER — HYOSCYAMINE SULFATE 0.5 MG/ML IJ SOLN
0.1250 mg | Freq: Once | INTRAMUSCULAR | Status: AC
  Administered 2016-07-21: 0.125 mg via INTRAVENOUS
  Filled 2016-07-21: qty 0.25

## 2016-07-21 NOTE — ED Notes (Signed)
Patient transported to CT 

## 2016-07-21 NOTE — ED Triage Notes (Signed)
Pt developed sharp pain at umbilicus this morning around 8am. Pt states radiates to epigastric area. Pt reports she vomited x1 this morning. No diarrhea.

## 2016-07-21 NOTE — ED Provider Notes (Signed)
[name] Provider Note   CSN: [id number] Arrival date & time: 07/21/16  0906     History   Chief Complaint Chief Complaint  Patient presents with  . Abdominal Pain    HPI Carolyn David is a 46 y.o. female.  Patient presents with sudden onset periumbilical abdominal pain this morning causing intermittent nausea with vomiting x 1. No fever. She denies previous similar symptoms. No pain in the back. No diarrhea, blood in her emesis. She is otherwise healthy.   The history is provided by the patient. No language interpreter was used.    Past Medical History:  Diagnosis Date  . Anxiety   . Asthma   . Breast lump    left  . Breast pain    left  . Depression   . Fibroids   . PONV (postoperative nausea and vomiting)     Patient Active Problem List   Diagnosis Date Noted  . Fibroids 12/25/2012    Past Surgical History:  Procedure Laterality Date  . BREAST FIBROADENOMA SURGERY     left  . KNEE ARTHROSCOPY     right  . LAPAROSCOPIC ENDOMETRIOSIS FULGURATION     negative result    OB History    No data available       Home Medications    Prior to Admission medications   Medication Sig Start Date End Date Taking? Authorizing Provider  albuterol (PROVENTIL HFA;VENTOLIN HFA) 108 (90 BASE) MCG/ACT inhaler Inhale 2 puffs into the lungs every 6 (six) hours as needed for wheezing.   Yes Historical Provider, MD  budesonide-formoterol (SYMBICORT) 160-4.5 MCG/ACT inhaler Inhale 2 puffs into the lungs 2 (two) times daily.   Yes Historical Provider, MD  escitalopram (LEXAPRO) 20 MG tablet Take 20 mg by mouth at bedtime. 07/16/16  Yes Historical Provider, MD  fexofenadine (ALLEGRA) 180 MG tablet Take 180 mg by mouth daily.   Yes Historical Provider, MD  fluticasone (FLONASE) 50 MCG/ACT nasal spray Place 1 spray into both nostrils daily.    Yes Historical Provider, MD  norgestimate-ethinyl estradiol (ORTHO-CYCLEN,SPRINTEC,PREVIFEM) 0.25-35 MG-MCG tablet Take 1 tablet  by mouth daily. 07/08/16  Yes Historical Provider, MD  ondansetron (ZOFRAN) 4 MG tablet Take 1 tablet (4 mg total) by mouth every 8 (eight) hours as needed for nausea or vomiting. Patient not taking: Reported on 07/21/2016 09/10/14   Waldemar Dickens, MD    Family History Family History  Problem Relation Age of Onset  . Asthma Mother   . Ectodermal dysplasia Mother   . Asthma Father   . Asthma Sister   . Asthma Brother     Social History Social History  Substance Use Topics  . Smoking status: Never Smoker  . Smokeless tobacco: Never Used  . Alcohol use Yes     Comment: rarely has one drink/week     Allergies   Latex and Erythromycin   Review of Systems Review of Systems  Constitutional: Negative for chills and fever.  HENT: Negative.   Respiratory: Negative.   Cardiovascular: Negative.   Gastrointestinal: Positive for abdominal pain, nausea and vomiting.  Genitourinary: Negative.  Negative for dysuria, vaginal bleeding and vaginal discharge.  Musculoskeletal: Negative.  Negative for back pain.  Skin: Negative.   Neurological: Negative.      Physical Exam Updated Vital Signs BP 122/75 (BP Location: Right Arm)   Pulse 72   Temp 97.4 F (36.3 C) (Oral)   Resp 16   SpO2 100%   Physical Exam  Constitutional:  She is oriented to person, place, and time. She appears well-developed and well-nourished.  HENT:  Head: Normocephalic.  Neck: Normal range of motion. Neck supple.  Cardiovascular: Normal rate and regular rhythm.   Pulmonary/Chest: Effort normal and breath sounds normal. She has no wheezes. She has no rales.  Abdominal: Soft. Bowel sounds are normal. There is tenderness. There is no rebound and no guarding.  Periumbilical tenderness without guarding.  Musculoskeletal: Normal range of motion.  Neurological: She is alert and oriented to person, place, and time.  Skin: Skin is warm and dry. No rash noted.  Psychiatric: She has a normal mood and affect.      ED Treatments / Results  Labs (all labs ordered are listed, but only abnormal results are displayed) Labs Reviewed  COMPREHENSIVE METABOLIC PANEL - Abnormal; Notable for the following:       Result Value   CO2 18 (*)    Glucose, Bld 104 (*)    Calcium 8.8 (*)    ALT 12 (*)    All other components within normal limits  CBC - Abnormal; Notable for the following:    MCV 76.8 (*)    MCH 25.3 (*)    RDW 17.0 (*)    All other components within normal limits  URINALYSIS, ROUTINE W REFLEX MICROSCOPIC - Abnormal; Notable for the following:    Hgb urine dipstick MODERATE (*)    Ketones, ur 5 (*)    Protein, ur 30 (*)    Leukocytes, UA TRACE (*)    Bacteria, UA RARE (*)    Squamous Epithelial / LPF 0-5 (*)    All other components within normal limits  LIPASE, BLOOD    EKG  EKG Interpretation None       Radiology No results found.  Procedures Procedures (including critical care time)  Medications Ordered in ED Medications  HYDROmorphone (DILAUDID) injection 0.5 mg (0.5 mg Intravenous Given 07/21/16 1007)  ondansetron (ZOFRAN) injection 4 mg (4 mg Intravenous Given 07/21/16 1007)  hyoscyamine (LEVSIN) 0.5 MG/ML injection 0.125 mg (0.125 mg Intravenous Given 07/21/16 1013)  iopamidol (ISOVUE-300) 61 % injection (100 mLs  Contrast Given 07/21/16 1239)     Initial Impression / Assessment and Plan / ED Course  I have reviewed the triage vital signs and the nursing notes.  Pertinent labs & imaging results that were available during my care of the patient were reviewed by me and considered in my medical decision making (see chart for details).   Patient presents with abdominal pain and appears very uncomfortable. IV fluids and medications ordered. Discussed with dr. Sherry Ruffing. Will get CT to evaluate secondary to pain level. So far, labs reassuring.   No UTI, leukocytosis, or other significant abnormality.   1:00 - Patient re-evaluated and, per SO at bedside, pain remains  controlled. Patient in CT.  2:00 - CT scan showing nonspecific results:  IMPRESSION: 1. Nonspecific mild fluid distended short segment of terminal ileum weights some air-fluid levels. Mild ileus or enteritis cannot be excluded. There is no evidence of transition point in caliber of small bowel to suggest definite small bowel obstruction.  Doubt obstruction. Pain is resolved, no leukocytosis, repeat exam benign.   2. Moderate stool noted within right colon and cecum. No pericecal inflammation. There is a low lying cecum. Appendix is not identified.  Patient has had appendectomy.   3. Enlarged uterus with fibroids again noted. Again noted a dominant fibroid within left uterine body measures about 6.5 cm. Peripheral rim calcification is  noted within fibroid. There is some eccentic low-density fluid within medial aspect of the fibroid with lenticular form measures 3.5 by 1.9 cm. This may be due to necrotic changes or small trapped endometrial fluid. No evidence of hemorrhage or acute inflammatory changes. Correlation with GYN exam and further evaluation with MRI could be performed as clinically warranted. Small free fluid noted within right posterior cul-de-sac. No adnexal mass.  On re-examination, there is no suprapubic tenderness. Doubt acute pelvic process.   Discussed discharge home with patient and husband. They are both comfortable with discharge home and understand close follow up is essential. She will call Dr. Marisue Humble tomorrow. Return precautions of fever, recurrent/severe pain, bloody emesis, new concern were discussed.   Will discharge home with Levsin, Zofran.   Final Clinical Impressions(s) / ED Diagnoses   Final diagnoses:  None   1. Abdominal pain  New Prescriptions New Prescriptions   No medications on file     Charlann Lange, Hershal Coria 07/21/16 1411    Gwenyth Allegra Tegeler, MD 07/22/16 1101

## 2016-10-17 ENCOUNTER — Other Ambulatory Visit: Payer: Self-pay | Admitting: Gastroenterology

## 2016-10-17 DIAGNOSIS — K824 Cholesterolosis of gallbladder: Secondary | ICD-10-CM

## 2016-10-25 ENCOUNTER — Other Ambulatory Visit: Payer: 59

## 2016-10-28 ENCOUNTER — Ambulatory Visit
Admission: RE | Admit: 2016-10-28 | Discharge: 2016-10-28 | Disposition: A | Discharge status: Home / Self Care | Payer: 59 | Source: Ambulatory Visit | Attending: Gastroenterology | Admitting: Gastroenterology

## 2016-10-28 DIAGNOSIS — K824 Cholesterolosis of gallbladder: Secondary | ICD-10-CM

## 2018-01-08 DIAGNOSIS — Z13220 Encounter for screening for lipoid disorders: Secondary | ICD-10-CM | POA: Diagnosis not present

## 2018-01-08 DIAGNOSIS — Z131 Encounter for screening for diabetes mellitus: Secondary | ICD-10-CM | POA: Diagnosis not present

## 2018-01-08 DIAGNOSIS — Z0000 Encounter for general adult medical examination without abnormal findings: Secondary | ICD-10-CM | POA: Diagnosis not present

## 2018-01-08 DIAGNOSIS — Z23 Encounter for immunization: Secondary | ICD-10-CM | POA: Diagnosis not present

## 2018-01-21 DIAGNOSIS — M7542 Impingement syndrome of left shoulder: Secondary | ICD-10-CM | POA: Diagnosis not present

## 2018-02-18 DIAGNOSIS — M7542 Impingement syndrome of left shoulder: Secondary | ICD-10-CM | POA: Diagnosis not present

## 2018-03-18 DIAGNOSIS — M7542 Impingement syndrome of left shoulder: Secondary | ICD-10-CM | POA: Diagnosis not present

## 2018-04-01 DIAGNOSIS — Z01411 Encounter for gynecological examination (general) (routine) with abnormal findings: Secondary | ICD-10-CM | POA: Diagnosis not present

## 2018-09-17 DIAGNOSIS — K580 Irritable bowel syndrome with diarrhea: Secondary | ICD-10-CM | POA: Diagnosis not present

## 2018-11-24 DIAGNOSIS — L821 Other seborrheic keratosis: Secondary | ICD-10-CM | POA: Diagnosis not present

## 2019-01-06 ENCOUNTER — Other Ambulatory Visit: Payer: Self-pay | Admitting: Nurse Practitioner

## 2019-01-06 DIAGNOSIS — Z8639 Personal history of other endocrine, nutritional and metabolic disease: Secondary | ICD-10-CM | POA: Diagnosis not present

## 2019-01-06 DIAGNOSIS — N6452 Nipple discharge: Secondary | ICD-10-CM

## 2019-01-13 ENCOUNTER — Other Ambulatory Visit: Payer: Self-pay

## 2019-01-13 ENCOUNTER — Ambulatory Visit
Admission: RE | Admit: 2019-01-13 | Discharge: 2019-01-13 | Disposition: A | Discharge status: Home / Self Care | Payer: BC Managed Care – PPO | Source: Ambulatory Visit | Attending: Nurse Practitioner | Admitting: Nurse Practitioner

## 2019-01-13 ENCOUNTER — Ambulatory Visit
Admission: RE | Admit: 2019-01-13 | Discharge: 2019-01-13 | Disposition: A | Discharge status: Home / Self Care | Payer: 59 | Source: Ambulatory Visit | Attending: Nurse Practitioner | Admitting: Nurse Practitioner

## 2019-01-13 DIAGNOSIS — N6452 Nipple discharge: Secondary | ICD-10-CM

## 2019-01-20 DIAGNOSIS — Z13220 Encounter for screening for lipoid disorders: Secondary | ICD-10-CM | POA: Diagnosis not present

## 2019-01-20 DIAGNOSIS — Z23 Encounter for immunization: Secondary | ICD-10-CM | POA: Diagnosis not present

## 2019-01-20 DIAGNOSIS — Z0000 Encounter for general adult medical examination without abnormal findings: Secondary | ICD-10-CM | POA: Diagnosis not present

## 2019-02-25 DIAGNOSIS — F4323 Adjustment disorder with mixed anxiety and depressed mood: Secondary | ICD-10-CM | POA: Diagnosis not present

## 2019-03-03 DIAGNOSIS — F4323 Adjustment disorder with mixed anxiety and depressed mood: Secondary | ICD-10-CM | POA: Diagnosis not present

## 2019-03-10 DIAGNOSIS — F4323 Adjustment disorder with mixed anxiety and depressed mood: Secondary | ICD-10-CM | POA: Diagnosis not present

## 2019-03-31 DIAGNOSIS — F4323 Adjustment disorder with mixed anxiety and depressed mood: Secondary | ICD-10-CM | POA: Diagnosis not present

## 2020-10-16 ENCOUNTER — Other Ambulatory Visit: Payer: Self-pay | Admitting: Obstetrics and Gynecology

## 2021-05-08 ENCOUNTER — Other Ambulatory Visit: Payer: Self-pay | Admitting: Family Medicine

## 2021-05-08 DIAGNOSIS — Z1231 Encounter for screening mammogram for malignant neoplasm of breast: Secondary | ICD-10-CM

## 2021-05-25 ENCOUNTER — Ambulatory Visit
Admission: RE | Admit: 2021-05-25 | Discharge: 2021-05-25 | Disposition: A | Discharge status: Home / Self Care | Payer: No Typology Code available for payment source | Source: Ambulatory Visit

## 2021-05-25 DIAGNOSIS — Z1231 Encounter for screening mammogram for malignant neoplasm of breast: Secondary | ICD-10-CM

## 2022-10-26 IMAGING — MG MM DIGITAL SCREENING BILAT W/ TOMO AND CAD
8 series · 9 of 24 positions shown · non-contrast
Comparison: Previous exam(s).

CLINICAL DATA: Screening.

EXAM:
DIGITAL SCREENING BILATERAL MAMMOGRAM WITH TOMOSYNTHESIS AND CAD
TECHNIQUE: Bilateral screening digital craniocaudal and mediolateral oblique
mammograms were obtained. Bilateral screening digital breast
tomosynthesis was performed. The images were evaluated with
computer-aided detection.

[L MLO synth-2D]
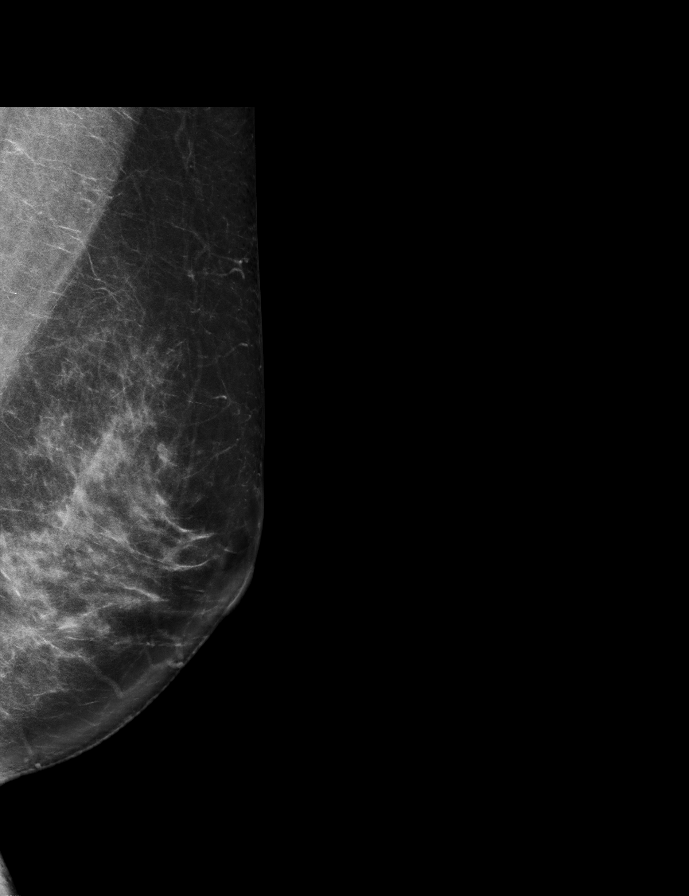

[R MLO synth-2D]
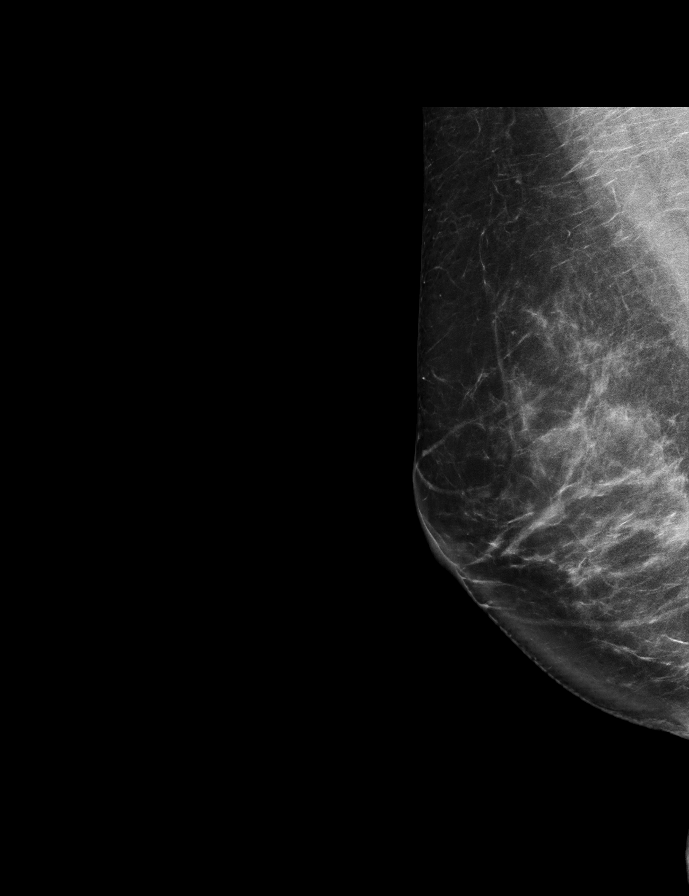

[R CC synth-2D]
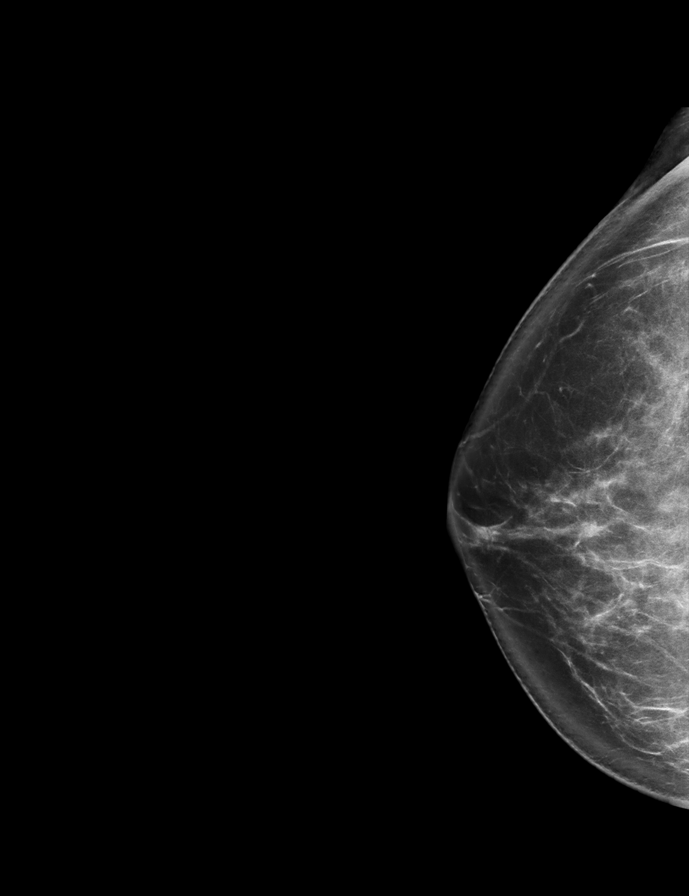

[L CC synth-2D]
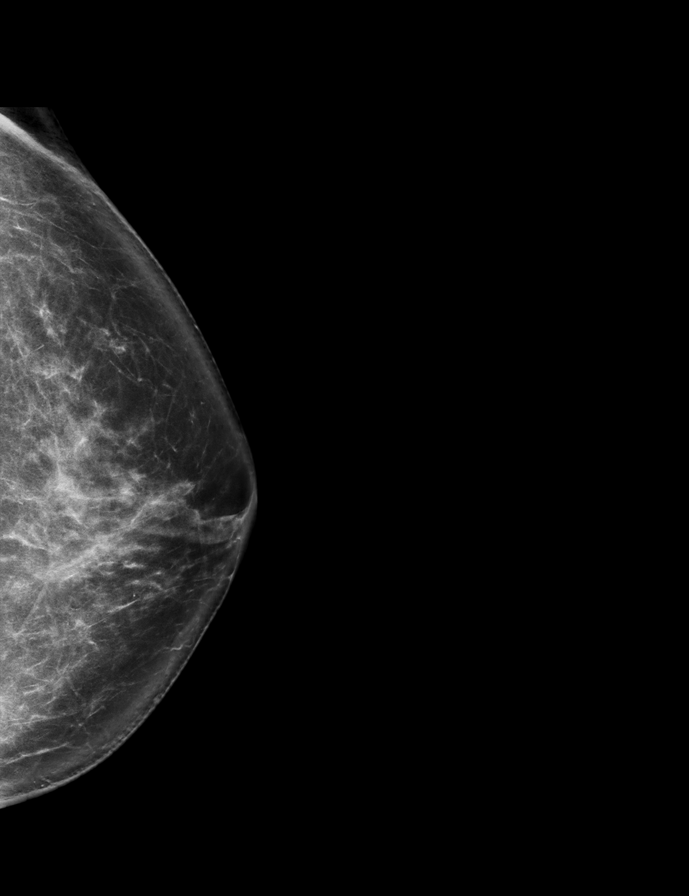

[R CC tomo · 2 of 82 frames shown]
[frame 27/82]
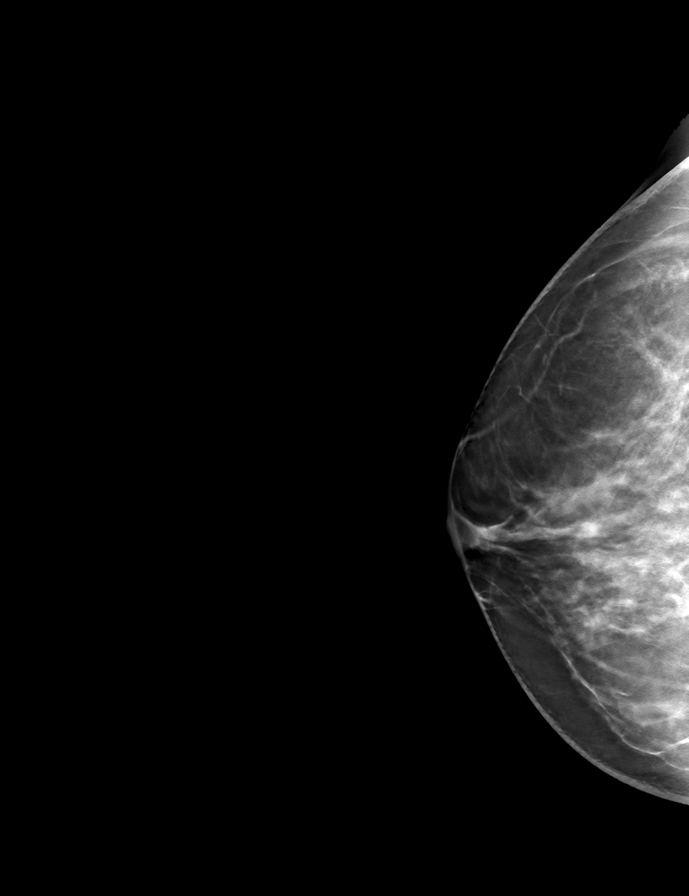
[frame 41/82]
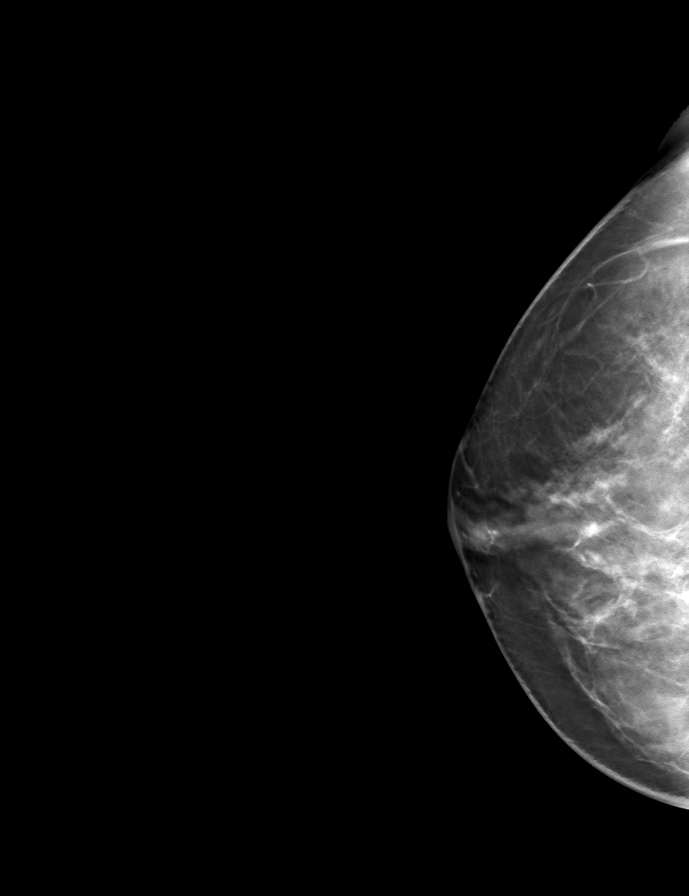

[L MLO tomo · tomo slice 39/77.0]
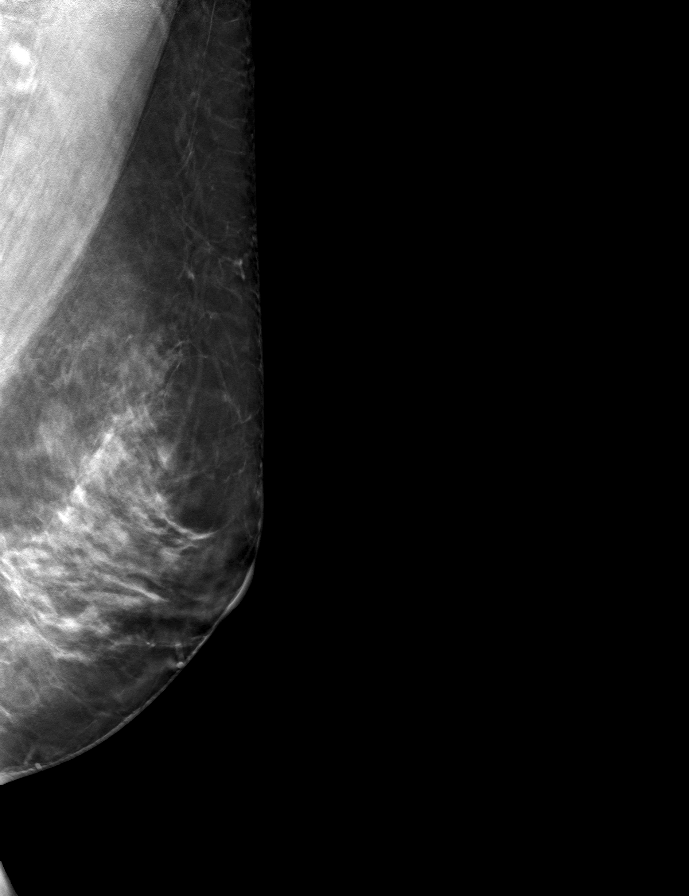

[R MLO tomo · tomo slice 38/75.0]
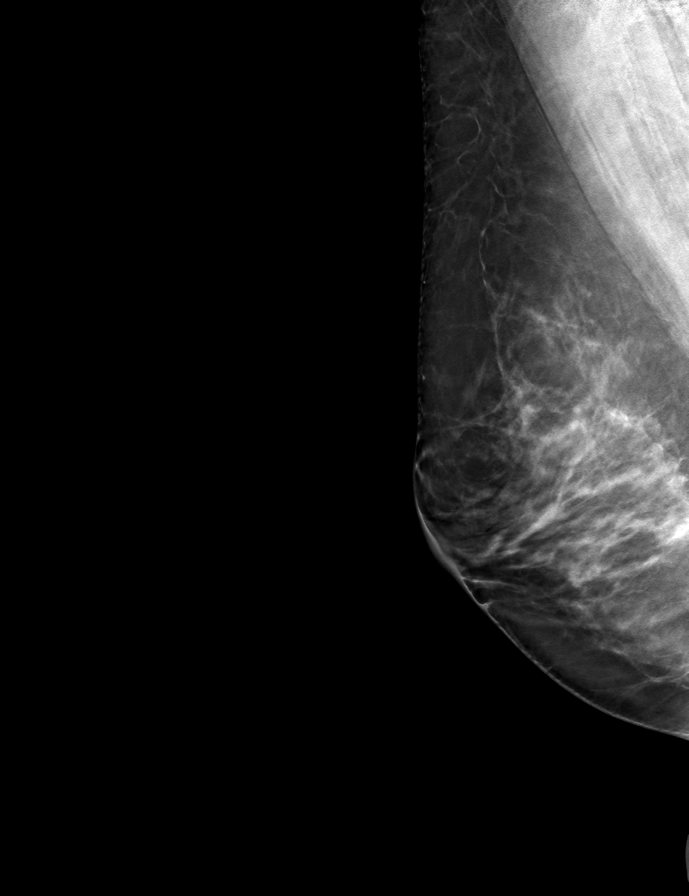

[L CC tomo · tomo slice 40/79.0]
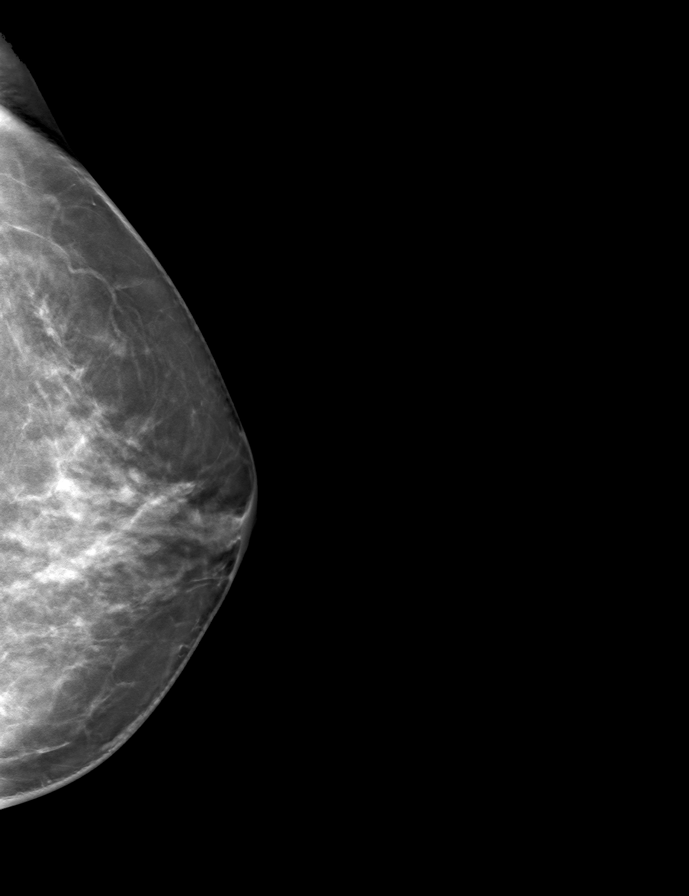

[9 of 24 positions shown; findings below may reference images not displayed]

ACR Breast Density Category b: There are scattered areas of
fibroglandular density.
FINDINGS: There are no findings suspicious for malignancy.
IMPRESSION: No mammographic evidence of malignancy. A result letter of this
screening mammogram will be mailed directly to the patient.

RECOMMENDATION:
Screening mammogram in one year. (Code:51-O-LD2)

BI-RADS CATEGORY  1: Negative.

## 2023-01-16 ENCOUNTER — Ambulatory Visit (INDEPENDENT_AMBULATORY_CARE_PROVIDER_SITE_OTHER): Payer: No Typology Code available for payment source | Admitting: Allergy & Immunology

## 2023-01-16 ENCOUNTER — Encounter: Payer: Self-pay | Admitting: Allergy & Immunology

## 2023-01-16 ENCOUNTER — Other Ambulatory Visit: Payer: Self-pay

## 2023-01-16 VITALS — BP 118/74 | HR 87 | Temp 98.0°F | Resp 18 | Ht 60.24 in | Wt 136.8 lb

## 2023-01-16 DIAGNOSIS — J4520 Mild intermittent asthma, uncomplicated: Secondary | ICD-10-CM | POA: Diagnosis not present

## 2023-01-16 DIAGNOSIS — R220 Localized swelling, mass and lump, head: Secondary | ICD-10-CM

## 2023-01-16 DIAGNOSIS — J310 Chronic rhinitis: Secondary | ICD-10-CM

## 2023-01-16 DIAGNOSIS — R221 Localized swelling, mass and lump, neck: Secondary | ICD-10-CM | POA: Diagnosis not present

## 2023-01-16 NOTE — Patient Instructions (Addendum)
1. Mild intermittent asthma, uncomplicated - Lung testing not done today. - I think you have this under control.  - We are not going to make any changes.   2. Chronic rhinitis - This seems to be under control with the use of your current regimen. - We did not do testing today.   3. 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 - Copy of testing results provided.  - Consider Xolair for treatment of food allergies. - We are going to get blood work to clarify the nut allergies and the green pepper allergy. - EpiPen training provided.  4. Return in about 3 months (around 04/17/2023). You can have the follow up appointment with Dr. Dellis Anes or a Nurse Practicioner (our Nurse Practitioners are excellent and always have Physician oversight!).    Please inform us of any Emergency Department visits, hospitalizations, or changes in symptoms. Call us before going to the ED for breathing or allergy symptoms since we might be able to fit you in for a sick visit. Feel free to contact us anytime with any questions, problems, or concerns.  It was a pleasure to meet you today!  Websites that have reliable patient information: 1. American Academy of Asthma, Allergy, and Immunology: www.aaaai.org 2. Food Allergy Research and Education (FARE): foodallergy.org 3. Mothers of Asthmatics: http://www.asthmacommunitynetwork.org 4. American College of Allergy, Asthma, and Immunology: www.acaai.org   COVID-19 Vaccine Information can be found at: [email or web address].nl For questions related to vaccine distribution or appointments, please email [email or web address] or call [phone].     "Like" [place] on Facebook and Instagram for our latest updates!      A healthy democracy works best when [employer] participate! Make sure you are registered to vote! If you have moved or changed any of your contact information, you  will need to get this updated before voting! Scan the QR codes below to learn more!         Food Adult Perc - 01/16/23 1400     Time Antigen Placed 1406    Allergen Manufacturer Waynette Buttery    Location Back    Number of allergen test 10     Control-buffer 50% Glycerol Negative    Control-Histamine 2+    1. Peanut Negative    10. Cashew Negative    11. Walnut Food --   3 x 5   12. Almond --   5 x 11   13. Hazelnut --   6 x 12   14. Pecan Food Negative    15. Pistachio Negative    16. [place] Nut Negative    17. Coconut Negative    45. Green Pepper Negative

## 2023-01-16 NOTE — Progress Notes (Signed)
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 Mild intermittent asthma, uncomplicated - Lung testing not done today. - I think you have this under control.  - We are not going to make any changes.   2. Chronic rhinitis - This seems to be under control with the use of your current regimen. - We did not do testing today.   3. 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 - Copy of testing results provided.  - Consider Xolair for treatment of food allergies. - We are going to get blood work to clarify the nut allergies and the green pepper allergy. - EpiPen training provided.  4. Return in about 3 months (around 04/17/2023). You can have the follow up appointment with Dr. Dellis Anes or a Nurse Practicioner (our Nurse Practitioners are excellent and always have Physician oversight!).    This note in its entirety was forwarded to the Provider who requested this consultation.  Subjective:   Carolyn David is a 52 y.o. female presenting today for evaluation of  Chief Complaint  Patient presents with   Allergic Reaction    Pepper - sore in her mouth, gas, bloating, diarrhea/ nuts - gi upset, hot flashes    Carolyn David has a history of the following: Patient Active Problem List   Diagnosis Date Noted   Fibroids 12/25/2012    History obtained from: chart review and patient.  Carolyn David was referred by Blair Heys, MD.     Carolyn David is a 52 y.o. female presenting for an evaluation of environmental allergies .   Asthma/Respiratory Symptom History: She was using albuteral initially. But then  after COVID she had worsening symptoms that improved with Singulair. She takes this all year round. She has Symbicort that she uses when she gets sick to keep it from progressing.   Her symptoms have been better controlled as of late.  Allergic Rhinitis Symptom History: Currently she does have environmental allergies. She is using Flonase and Allegra. She is allergic cats and dust and pollens.   This was done when she lived in [place].   Food Allergy Symptom History: She has some pepper allergies with emergence of caker sores (cooked and raw). Then she gets digestive issues and bloating and diarrhea. She also started to get hot flashes like she was coming down with the flu. She did some elimination stuff and she knows that nuts (building over the course of days), she has this reaction. She has never needed an EpiPen and has never needed to go to the hospital. The peppers have been decades. This was pre-2005. She moved here from [place]. The nuts are a new thing over the last 4 years. She has tried nuts from various sources.  She does not think that this is related to any additives or pesticides.   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  She has a sibling who has it pretty badly.  She has a few manifestations of and including retained primary teeth.  She has to have a lot of expensive sounding dental work to cope with this.  Otherwise, there is no history of other atopic  diseases, including asthma, drug allergies, stinging insect allergies, eczema, urticaria, or contact dermatitis. There is no significant infectious history. Vaccinations are up to date.    Past Medical History: Patient Active Problem List   Diagnosis Date Noted   Fibroids 12/25/2012    Medication List:  Allergies as of 01/16/2023       Reactions   Latex Rash   Erythromycin Nausea And Vomiting        Medication List        Accurate as of January 16, 2023 11:59 PM. If you have any questions, ask your nurse  or doctor.          albuterol 108 (90 Base) MCG/ACT inhaler Commonly known as: VENTOLIN HFA Inhale 2 puffs into the lungs every 6 (six) hours as needed for wheezing.   budesonide-formoterol 160-4.5 MCG/ACT inhaler Commonly known as: SYMBICORT Inhale 2 puffs into the lungs 2 (two) times daily.   busPIRone 7.5 MG tablet Commonly known as: BUSPAR Take 7.5 mg by mouth 2 (two) times daily.   escitalopram 20 MG tablet Commonly known as: LEXAPRO Take 20 mg by mouth at bedtime.   fexofenadine 180 MG tablet Commonly known as: ALLEGRA Take 180 mg by mouth daily.   fluticasone 50 MCG/ACT nasal spray Commonly known as: FLONASE Place 1 spray into both nostrils daily.   Hyoscyamine Sulfate SL 0.125 MG Subl Commonly known as: Levsin/SL Place 0.125 mg under the tongue every 4 (four) hours as needed.   montelukast 10 MG tablet Commonly known as: SINGULAIR SMARTSIG:1 Tablet(s) By Mouth Every Evening   norgestimate-ethinyl estradiol 0.25-35 MG-MCG tablet Commonly known as: ORTHO-CYCLEN Take 1 tablet by mouth daily.   ondansetron 4 MG tablet Commonly known as: Zofran Take 1 tablet (4 mg total) by mouth every 8 (eight) hours as needed for nausea or vomiting.        Birth History: non-contributory  Developmental History: non-contributory  Past Surgical History: Past Surgical History:  Procedure Laterality Date   BREAST EXCISIONAL BIOPSY Left    BREAST FIBROADENOMA SURGERY     left   KNEE ARTHROSCOPY     right   LAPAROSCOPIC ENDOMETRIOSIS FULGURATION     negative result     Family History: Family History  Problem Relation Age of Onset   Asthma Mother    Ectodermal dysplasia Mother    Asthma Father    Asthma Sister    Asthma Brother      Social History: Carolyn David lives at home with her wife along with their female partner. She also has one adopted daughter at home.  She lives in a house that is 52 years old.  There is mildew in the basement.  There is wood flooring  throughout the home.  They have gas heating and central cooling.  There is 1 cat and 1 dog in the home.  There are dust mite covers on the pillows, but not the bed.  There is no tobacco exposure.  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  There are no fume, chemical, or dust exposures.  She does use a HEPA filter in her home.  She does not live near an interstate or industrial area. She is working on trays and mugs now. She has been doing pottery for 6 months.     Review of systems otherwise negative other than that mentioned in the HPI.    Objective:   Blood pressure 118/74, pulse 87, temperature 98 F (  36.7 C), resp. rate 18, height 5' 0.24" (1.53 m), weight 136 lb 12.8 oz (62.1 kg), SpO2 97%. Body mass index is 26.51 kg/m.     Physical Exam Vitals reviewed.  Constitutional:      Appearance: She is well-developed.     Comments: Friendly.  HENT:     Head: Normocephalic and atraumatic.     Right Ear: Tympanic membrane, ear canal and external ear normal. No drainage, swelling or tenderness. Tympanic membrane is not injected, scarred, erythematous, retracted or bulging.     Left Ear: Tympanic membrane, ear canal and external ear normal. No drainage, swelling or tenderness. Tympanic membrane is not injected, scarred, erythematous, retracted or bulging.     Nose: No nasal deformity, septal deviation, mucosal edema or rhinorrhea.     Right Turbinates: Enlarged and swollen.     Left Turbinates: Enlarged and swollen.     Right Sinus: No maxillary sinus tenderness or frontal sinus tenderness.     Left Sinus: No maxillary sinus tenderness or frontal sinus tenderness.     Mouth/Throat:     Mouth: Mucous membranes are not pale and not dry.     Pharynx: Uvula midline.  Eyes:     General: Lids are normal.        Right eye: No discharge.        Left eye: No discharge.     Conjunctiva/sclera: Conjunctivae normal.     Right eye: Right conjunctiva is not  injected. No chemosis.    Left eye: Left conjunctiva is not injected. No chemosis.    Pupils: Pupils are equal, round, and reactive to light.  Cardiovascular:     Rate and Rhythm: Normal rate and regular rhythm.     Heart sounds: Normal heart sounds.  Pulmonary:     Effort: Pulmonary effort is normal. No tachypnea, accessory muscle usage or respiratory distress.     Breath sounds: Normal breath sounds. No wheezing, rhonchi or rales.  Chest:     Chest wall: No tenderness.  Abdominal:     Tenderness: There is no abdominal tenderness. There is no guarding or rebound.  Lymphadenopathy:     Head:     Right side of head: No submandibular, tonsillar or occipital adenopathy.     Left side of head: No submandibular, tonsillar or occipital adenopathy.     Cervical: No cervical adenopathy.  Skin:    General: Skin is warm.     Capillary Refill: Capillary refill takes less than 2 seconds.     Coloration: Skin is not pale.     Findings: No abrasion, erythema, petechiae or rash. Rash is not papular, urticarial or vesicular.     Comments: Fair skin.  Neurological:     Mental Status: She is alert.  Psychiatric:        Behavior: Behavior is cooperative.      Diagnostic studies:    Allergy Studies:  Food Adult Perc - 01/16/23 1400     Time Antigen Placed 1406    Allergen Manufacturer Waynette Buttery    Location Back    Number of allergen test 10     Control-buffer 50% Glycerol Negative    Control-Histamine 2+    1. Peanut Negative    10. Cashew Negative    11. Walnut Food --   3 x 5   12. Almond --   5 x 11   13. Hazelnut --   6 x 12   14. Pecan Food Negative    15.  Pistachio Negative    16. [place] Nut Negative    17. Coconut Negative    45. Green Pepper Negative       Allergy testing results were read and interpreted by myself, documented by clinical staff.         Malachi Bonds, MD [facility]

## 2023-01-18 LAB — IGE NUT PROF. W/COMPONENT RFLX

## 2023-01-19 LAB — TRYPTASE: Tryptase: 2.7 ug/L (ref 2.2–13.2)

## 2023-01-20 ENCOUNTER — Encounter: Payer: Self-pay | Admitting: Allergy & Immunology

## 2023-01-20 LAB — IGE NUT PROF. W/COMPONENT RFLX: F017-IgE Hazelnut (Filbert): 0.17 kU/L — AB

## 2023-01-20 LAB — ALLERGEN,CHILI PEPPER,RF279

## 2023-01-20 LAB — ALLERGEN COMPONENT COMMENTS

## 2023-01-20 LAB — PANEL 604726
Cor A 1 IgE: 0.24 kU/L — AB
Cor A 14 IgE: <0.1 kU/L
Cor A 8 IgE: <0.1 kU/L
Cor A 9 IgE: <0.1 kU/L

## 2023-01-20 LAB — IGE: IgE (Immunoglobulin E), Serum: 30 IU/mL (ref 6–495)

## 2023-01-20 LAB — ALLERGEN,GRN PEPPER,PAPRIKA,F218: Paprika IgE: <0.1 kU/L

## 2023-01-20 LAB — F263-IGE GREEN BELL PEPPER

## 2023-02-06 ENCOUNTER — Encounter: Payer: Self-pay | Admitting: Allergy & Immunology

## 2023-04-08 ENCOUNTER — Other Ambulatory Visit: Payer: Self-pay

## 2023-04-08 ENCOUNTER — Ambulatory Visit: Payer: No Typology Code available for payment source | Admitting: Allergy & Immunology

## 2023-04-08 ENCOUNTER — Encounter: Payer: Self-pay | Admitting: Allergy & Immunology

## 2023-04-08 VITALS — BP 120/72 | HR 65 | Temp 97.2°F | Resp 18

## 2023-04-08 DIAGNOSIS — J4520 Mild intermittent asthma, uncomplicated: Secondary | ICD-10-CM | POA: Diagnosis not present

## 2023-04-08 DIAGNOSIS — R220 Localized swelling, mass and lump, head: Secondary | ICD-10-CM

## 2023-04-08 DIAGNOSIS — R221 Localized swelling, mass and lump, neck: Secondary | ICD-10-CM | POA: Diagnosis not present

## 2023-04-08 DIAGNOSIS — J310 Chronic rhinitis: Secondary | ICD-10-CM | POA: Diagnosis not present

## 2023-04-08 MED ORDER — ALBUTEROL SULFATE HFA 108 (90 BASE) MCG/ACT IN AERS: 2.0000 | INHALATION_SPRAY | Freq: Four times a day (QID) | RESPIRATORY_TRACT | 2 refills | Status: AC | PRN

## 2023-04-08 NOTE — Progress Notes (Unsigned)
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   Subjective:   Carolyn David is a 52 y.o. female presenting today for follow up of  Chief Complaint  Patient presents with   Follow-up   Asthma    No questions or concerns    Carolyn David has a history of the following: Patient Active Problem List   Diagnosis Date Noted   Fibroids 12/25/2012    History obtained from: chart review and {Persons; PED relatives w/patient:19415::"patient"}.  Discussed the use of AI scribe software for clinical note transcription with the patient and/or guardian, who gave verbal consent to proceed.  Carolyn David is a 52 y.o. female presenting for {Blank single:19197::"a food challenge","a drug challenge","skin testing","a sick visit","an evaluation of ***","a follow up visit"}.  We last saw her in September 2024.  At that time, lung testing looks great.  For her rhinitis, she was doing well without any medications.  We did not do any testing.  For her food allergy, she had testing that was positive to walnuts, almonds, initiate some nuts.  We talked about doing Xolair for treatment of her food allergies.  We sent in an EpiPen.  Her blood work was positive to hazelnut.  It was negative to pepper, including paprika and chili pepper.  Discussed the use of AI scribe software for clinical note transcription with the patient, who gave verbal consent to proceed.  History of Present Illness            {Blank single:19197::"Asthma/Respiratory Symptom History: ***"," "}  {Blank single:19197::"Allergic Rhinitis Symptom  History: ***"," "}  {Blank single:19197::"Food Allergy Symptom History: ***"," "}  {Blank single:19197::"Skin Symptom History: ***"," "}  {Blank single:19197::"GERD Symptom History: ***"," "}  Otherwise, there have been no changes to her past medical history, surgical history, family history, or social history.    Review of systems otherwise negative other than that mentioned in the HPI.    Objective:   Blood pressure 120/72, pulse 65, temperature (!) 97.2 F (36.2 C), temperature source Temporal, resp. rate 18, SpO2 97%. There is no height or weight on file to calculate BMI.    Physical Exam   Diagnostic studies: {Blank single:19197::"none","deferred due to recent antihistamine use","deferred due to insurance stipulations that require a separate visit for testing","labs sent instead"," "}  Spirometry: {Blank single:19197::"results normal (FEV1: ***%, FVC: ***%, FEV1/FVC: ***%)","results abnormal (FEV1: ***%, FVC: ***%, FEV1/FVC: ***%)"}.    {Blank single:19197::"Spirometry consistent with mild obstructive disease","Spirometry consistent with moderate obstructive disease","Spirometry consistent with severe obstructive disease","Spirometry consistent with possible restrictive disease","Spirometry consistent with mixed obstructive and restrictive disease","Spirometry uninterpretable due to technique","Spirometry consistent with normal pattern"}. {Blank single:19197::"Albuterol/Atrovent nebulizer","Xopenex/Atrovent nebulizer","Albuterol nebulizer","Albuterol four puffs via MDI","Xopenex four puffs via MDI"} treatment given in clinic with {Blank single:19197::"significant improvement in FEV1 per ATS criteria","significant improvement in FVC per ATS criteria","significant improvement in FEV1 and FVC per ATS criteria","improvement in FEV1, but not significant per ATS criteria","improvement in FVC, but not significant per ATS criteria","improvement in FEV1 and FVC, but not significant per ATS  criteria","no improvement"}.  Allergy Studies: {  Blank single:19197::"none","deferred due to recent antihistamine use","deferred due to insurance stipulations that require a separate visit for testing","labs sent instead"," "}    {Blank single:19197::"Allergy testing results were read and interpreted by myself, documented by clinical staff."," "}      Carolyn Bonds, MD  [facility]

## 2023-04-08 NOTE — Patient Instructions (Signed)
1. Mild intermittent asthma, uncomplicated - Continue with the montelukast daily as you are doing.  - I think you have this under any control.  - We are not going to make changes.   2. Chronic rhinitis - Continue with the montelukast daily.  3. 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  - Consider Xolair for treatment of food allergies. - We are going to get blood work to clarify the nut allergies and the green pepper allergy. - EpiPen training provided.  3. Follow up as needed.     Please inform us of any Emergency Department visits, hospitalizations, or changes in symptoms. Call us before going to the ED for breathing or allergy symptoms since we might be able to fit you in for a sick visit. Feel free to contact us anytime with any questions, problems, or concerns.  It was a pleasure to see you again today!  Websites that have reliable patient information: 1. American Academy of Asthma, Allergy, and Immunology: www.aaaai.org 2. Food Allergy Research and Education (FARE): foodallergy.org 3. Mothers of Asthmatics: http://www.asthmacommunitynetwork.org 4. American College of Allergy, Asthma, and Immunology: www.acaai.org      "Like" [place] on Facebook and Instagram for our latest updates!      A healthy democracy works best when [employer] participate! Make sure you are registered to vote! If you have moved or changed any of your contact information, you will need to get this updated before voting! Scan the QR codes below to learn more!

## 2023-04-09 ENCOUNTER — Encounter: Payer: Self-pay | Admitting: Allergy & Immunology

## 2023-04-09 MED ORDER — MONTELUKAST SODIUM 10 MG PO TABS: 10.0000 mg | ORAL_TABLET | Freq: Every day | ORAL | 3 refills | Status: DC

## 2023-10-21 ENCOUNTER — Other Ambulatory Visit: Payer: Self-pay | Admitting: Neurosurgery

## 2023-10-21 DIAGNOSIS — I671 Cerebral aneurysm, nonruptured: Secondary | ICD-10-CM

## 2023-10-30 ENCOUNTER — Other Ambulatory Visit: Payer: Self-pay

## 2023-10-30 ENCOUNTER — Other Ambulatory Visit: Payer: Self-pay | Admitting: Neurosurgery

## 2023-10-30 ENCOUNTER — Ambulatory Visit (HOSPITAL_COMMUNITY)
Admission: RE | Admit: 2023-10-30 | Discharge: 2023-10-30 | Disposition: A | Discharge status: Home / Self Care | Source: Ambulatory Visit | Attending: Neurosurgery | Admitting: Neurosurgery

## 2023-10-30 DIAGNOSIS — I729 Aneurysm of unspecified site: Secondary | ICD-10-CM

## 2023-10-30 DIAGNOSIS — I671 Cerebral aneurysm, nonruptured: Secondary | ICD-10-CM | POA: Insufficient documentation

## 2023-10-30 HISTORY — PX: IR ANGIO VERTEBRAL SEL VERTEBRAL BILAT MOD SED: IMG5369

## 2023-10-30 HISTORY — PX: IR ANGIO INTRA EXTRACRAN SEL INTERNAL CAROTID BILAT MOD SED: IMG5363

## 2023-10-30 LAB — URINALYSIS, ROUTINE W REFLEX MICROSCOPIC
Bilirubin Urine: NEGATIVE
Glucose, UA: NEGATIVE mg/dL
Ketones, ur: NEGATIVE mg/dL
Leukocytes,Ua: NEGATIVE
Nitrite: NEGATIVE
Protein, ur: NEGATIVE mg/dL
Specific Gravity, Urine: 1.015 (ref 1.005–1.030)
pH: 7 (ref 5.0–8.0)

## 2023-10-30 LAB — CBC WITH DIFFERENTIAL/PLATELET
Abs Immature Granulocytes: 0.01 10*3/uL (ref 0.00–0.07)
Basophils Absolute: 0 10*3/uL (ref 0.0–0.1)
Basophils Relative: 1 %
Eosinophils Absolute: 0.1 10*3/uL (ref 0.0–0.5)
Eosinophils Relative: 2 %
HCT: 41 % (ref 36.0–46.0)
Hemoglobin: 13.2 g/dL (ref 12.0–15.0)
Immature Granulocytes: 0 %
Lymphocytes Relative: 42 %
Lymphs Abs: 1.8 10*3/uL (ref 0.7–4.0)
MCH: 28.3 pg (ref 26.0–34.0)
MCHC: 32.2 g/dL (ref 30.0–36.0)
MCV: 87.8 fL (ref 80.0–100.0)
Monocytes Absolute: 0.3 10*3/uL (ref 0.1–1.0)
Monocytes Relative: 6 %
Neutro Abs: 2.1 10*3/uL (ref 1.7–7.7)
Neutrophils Relative %: 49 %
Platelets: 200 10*3/uL (ref 150–400)
RBC: 4.67 MIL/uL (ref 3.87–5.11)
RDW: 13.8 % (ref 11.5–15.5)
WBC: 4.3 10*3/uL (ref 4.0–10.5)
nRBC: 0 % (ref 0.0–0.2)

## 2023-10-30 LAB — BASIC METABOLIC PANEL WITH GFR
Anion gap: 8 (ref 5–15)
BUN: 8 mg/dL (ref 6–20)
CO2: 23 mmol/L (ref 22–32)
Calcium: 8.4 mg/dL — ABNORMAL LOW (ref 8.9–10.3)
Chloride: 103 mmol/L (ref 98–111)
Creatinine, Ser: 1.05 mg/dL — ABNORMAL HIGH (ref 0.44–1.00)
GFR, Estimated: >60 mL/min (ref >60)
Glucose, Bld: 86 mg/dL (ref 70–99)
Potassium: 3.9 mmol/L (ref 3.5–5.1)
Sodium: 134 mmol/L — ABNORMAL LOW (ref 135–145)

## 2023-10-30 LAB — PROTIME-INR
INR: 1 (ref 0.8–1.2)
Prothrombin Time: 13.8 s (ref 11.4–15.2)

## 2023-10-30 LAB — PREGNANCY, URINE: Preg Test, Ur: NEGATIVE

## 2023-10-30 LAB — APTT: aPTT: 26 s (ref 24–36)

## 2023-10-30 MED ORDER — FENTANYL CITRATE (PF) 100 MCG/2ML IJ SOLN
INTRAMUSCULAR | Status: AC
  Filled 2023-10-30: qty 2

## 2023-10-30 MED ORDER — HEPARIN SODIUM (PORCINE) 1000 UNIT/ML IJ SOLN
INTRAMUSCULAR | Status: AC
  Filled 2023-10-30: qty 2

## 2023-10-30 MED ORDER — HEPARIN SODIUM (PORCINE) 1000 UNIT/ML IJ SOLN
INTRAMUSCULAR | Status: AC | PRN
  Administered 2023-10-30: 2000 [IU] via INTRAVENOUS

## 2023-10-30 MED ORDER — MIDAZOLAM HCL 2 MG/2ML IJ SOLN
INTRAMUSCULAR | Status: AC
  Filled 2023-10-30: qty 2

## 2023-10-30 MED ORDER — CHLORHEXIDINE GLUCONATE CLOTH 2 % EX PADS: 6.0000 | MEDICATED_PAD | Freq: Once | CUTANEOUS | Status: DC

## 2023-10-30 MED ORDER — FENTANYL CITRATE (PF) 100 MCG/2ML IJ SOLN
INTRAMUSCULAR | Status: AC | PRN
  Administered 2023-10-30: 25 ug via INTRAVENOUS

## 2023-10-30 MED ORDER — MIDAZOLAM HCL 2 MG/2ML IJ SOLN
INTRAMUSCULAR | Status: AC | PRN
  Administered 2023-10-30: 1 mg via INTRAVENOUS

## 2023-10-30 MED ORDER — LIDOCAINE HCL 1 % IJ SOLN
20.0000 mL | Freq: Once | INTRAMUSCULAR | Status: AC
  Administered 2023-10-30: 10 mL

## 2023-10-30 MED ORDER — CEFAZOLIN SODIUM-DEXTROSE 2-4 GM/100ML-% IV SOLN: 2.0000 g | INTRAVENOUS | Status: DC

## 2023-10-30 MED ORDER — LIDOCAINE HCL 1 % IJ SOLN
INTRAMUSCULAR | Status: AC
  Filled 2023-10-30: qty 20

## 2023-10-30 MED ORDER — HYDROCODONE-ACETAMINOPHEN 5-325 MG PO TABS: 1.0000 | ORAL_TABLET | ORAL | Status: DC | PRN

## 2023-10-30 NOTE — H&P (Signed)
 Chief Complaint   Aneurysm  History of Present Illness    Carolyn David is a 53 year old woman seen for initial consultation at the request of Dr. Rhoda. She is referred for a recently incidentally discovered intracranial aneurysm. Briefly, the patient notes onset of bilateral pulsatile tinnitus about six months ago, without any identifiable inciting event. She describes a high-pitched &#34;wishing&#34; sound in both her ears, although it is intermittent throughout the day. Overall she said it is actually not terribly bothersome and certainly does not prevent her from sleeping. She was referred to ENT, where CT and CT angiogram were ordered. 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 Upon questioning, the patient does not report associated visual changes or new numbness tingling or weakness. She has a history of migraines, but this is chronic and well controlled.Of note, the patient denies any history of hypertension or diabetes. She does again have a history of migraines. No previous heart attack or stroke. No known lung, liver, kidney disease. She is not on any blood thinners or antiplatelet agents. She is a nonsmoker. There is no known family history of intracranial aneurysms.    Past Medical History   Past Medical History:  Diagnosis Date   Anxiety    Asthma    Breast lump    left   Breast pain    left   Depression    Fibroids    PONV (postoperative nausea and vomiting)     Past Surgical History   Past Surgical History:  Procedure Laterality Date   BREAST EXCISIONAL BIOPSY Left    BREAST FIBROADENOMA SURGERY     left   KNEE ARTHROSCOPY     right   LAPAROSCOPIC ENDOMETRIOSIS FULGURATION     negative result    Social History   Social History   Tobacco Use   Smoking status: Never    Passive exposure: Never   Smokeless tobacco: Never  Substance Use Topics   Alcohol use: Yes    Comment: rarely  has one drink/week   Drug use: No    Medications   Prior to Admission medications   Medication Sig Start Date End Date Taking? Authorizing Provider  busPIRone (BUSPAR) 7.5 MG tablet Take 7.5 mg by mouth 2 (two) times daily.   Yes [provider]  Cholecalciferol 50 MCG (2000 UT) TABS 1 tablet Orally Once a day   Yes [provider]  escitalopram (LEXAPRO) 20 MG tablet Take 20 mg by mouth at bedtime. 07/16/16  Yes [provider]  fexofenadine (ALLEGRA) 180 MG tablet Take 180 mg by mouth daily.   Yes [provider]  fluticasone (FLONASE) 50 MCG/ACT nasal spray Place 1 spray into both nostrils daily.    Yes [provider]  Magnesium 200 MG TABS 2 tablets with a meal Orally Once a day   Yes [provider]  montelukast  (SINGULAIR ) 10 MG tablet SMARTSIG:1 Tablet(s) By Mouth Every Evening   Yes [provider]  norgestimate-ethinyl estradiol (ORTHO-CYCLEN,SPRINTEC,PREVIFEM) 0.25-35 MG-MCG tablet Take 1 tablet by mouth daily. 07/08/16  Yes [provider]  albuterol  (VENTOLIN  HFA) 108 (90 Base) MCG/ACT inhaler Inhale 2 puffs into the lungs every 6 (six) hours as needed for wheezing or shortness of breath. 04/08/23   Iva Marty Saltness, MD  ALPRAZolam (XANAX) 0.5 MG tablet 1/2-1 tablet Orally up to twice a day as needed for anxiety 03/17/15   [provider]  budesonide-formoterol (SYMBICORT) 160-4.5 MCG/ACT inhaler Inhale  2 puffs into the lungs 2 (two) times daily.    [provider]  levonorgestrel-ethinyl estradiol (SEASONALE) 0.15-0.03 MG tablet Take 1 tablet by mouth daily. 10/28/22   [provider]  montelukast  (SINGULAIR ) 10 MG tablet Take 1 tablet (10 mg total) by mouth at bedtime. 04/09/23   Iva Marty Saltness, MD    Allergies   Allergies  Allergen Reactions   Latex Rash   Erythromycin Nausea And Vomiting    Review of Systems  ROS  Neurologic Exam  Awake, alert,  oriented Memory and concentration grossly intact Speech fluent, appropriate CN grossly intact Motor exam: Upper Extremities Deltoid Bicep Tricep Grip  Right 5/5 5/5 5/5 5/5  Left 5/5 5/5 5/5 5/5   Lower Extremities IP Quad PF DF EHL  Right 5/5 5/5 5/5 5/5 5/5  Left 5/5 5/5 5/5 5/5 5/5   Sensation grossly intact to LT  Impression  - 53 y.o. female with incidental discovery of possible left carotid aneurysm during w/u of tinnitus  Plan  - Will proceed with diagnostic cerebral angiogram  I have reviewed the indications for the procedure as well as the details of the procedure and the expected postoperative course and recovery at length with the patient in the office. We have also reviewed in detail the risks, benefits, and alternatives to the procedure. All questions were answered and Carolyn David provided informed consent to proceed.  Gerldine Maizes, MD [facility]

## 2023-10-30 NOTE — Progress Notes (Signed)
 Up and walked and tolerated well; right groin stable, no bleeding or hematoma

## 2023-11-05 NOTE — Op Note (Signed)
 ENDOVASCULAR NEUROSURGERY OPERATIVE NOTE   PROCEDURE: Diagnostic Cerebral Angiogram   SURGEON:   Dr. Gerldine Maizes, MD  HISTORY:   The patient is a 53 y.o. yo female Initially seen in the Outpatient Neurosurgery Clinic with pulsatile tinnitus and CT angiogram revealing the possible left carotid aneurysm.  Patient therefore presents today for further workup with diagnostic cerebral angiogram.  APPROACH:   The technical aspects of the procedure as well as its potential risks and benefits were reviewed with the patient. These risks included but were not limited bleeding, infection, allergic reaction, damage to organs/vital structures, stroke, non-diagnostic procedure, and the catastrophic outcomes of heart attack, coma, and death. With an understanding of these risks, informed consent was obtained and witnessed.    The patient was placed in the supine position on the angiography table and the skin of right groin prepped in the usual sterile fashion. The procedure was performed under local anesthesia (1%-solution of bicarbonate-bufferred Lidoacaine) and conscious sedation with Versed  and fentanyl  monitored by the in-suite nurse and myself, including non-invasive blood pressure and continuous pulse oxymetry.    Access to the right common femoral artery was obtained with ultrasound guidance and a five French sheath was placed using standard Seldinger technique.  Ultrasound guidance allowed direct visualization of the micropuncture needle into the lumen of the right common femoral artery.    HEPARIN :  2000 Units total.    CONTRAST AGENT:  See IR records   FLUOROSCOPY TIME:  See IR records    CATHETER(S) AND WIRE(S):    5-French JB-1 glidecatheter   0.035" glidewire    VESSELS CATHETERIZED:   Right internal carotid   Left internal carotid   Right vertebral   Left vertebral   Right common femoral  VESSELS STUDIED:   Right internal carotid, head Left internal carotid, head Left  vertebral Right vertebral Right femoral  PROCEDURAL NARRATIVE:   A 5-Fr JB-1 terumo glide catheter was advanced over a 0.035 glidewire into the aortic arch. The above vessels were then sequentially catheterized and cervical/cerebral angiograms taken. After review of images, the catheter was removed without incident.    INTERPRETATION:   Right internal carotid, head:   Injection reveals the presence of a widely patent ICA, M1, and A1 segments and their branches. No aneurysms, arteriovenous malformations, or high flow fistulas are visualized.  The parenchymal and venous phases are unremarkable. The venous sinuses are widely patent.    Left internal carotid, head:   Injection reveals the presence of a widely patent ICA, A1, and M1 segments and their branches.  There is a small, smooth appearing aneurysm arising from the abdominal segment projecting superiorly.  This aneurysm measures approximately 2.9 mm tall, an approximately 2.6 mm at its base. The parenchymal and venous phases are unremarkable. The venous sinuses are widely patent.    Left vertebral:   Injection reveals the presence of a widely patent vertebral artery. This leads to a widely patent basilar artery that terminates in bilateral P1. The basilar apex is normal. No aneurysms, arteriovenous malformations, or high flow fistulas are visualized. The parenchymal and venous phases are normal. The venous sinuses are patent.    Right vertebral:    Normal vessel. No PICA aneurysm. See basilar description above.    Right femoral:    Normal vessel. No significant atherosclerotic disease. Arterial sheath in adequate position.   DISPOSITION:  Upon completion of the study, the femoral sheath was removed and hemostasis obtained using a 5-Fr Exoseal closure device. Good proximal and  distal lower extremity pulses were documented upon achievement of hemostasis. The procedure was well tolerated and no early complications were observed.  The patient  was transferred to the recovery area to be positioned flat in bed for 3 hours.    IMPRESSION:  1. 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 2. No other intracranial aneurysms, AVM, or fistulas seen.    Gerldine Maizes, MD [facility]

## 2023-11-19 ENCOUNTER — Encounter (HOSPITAL_COMMUNITY): Payer: Self-pay

## 2024-04-11 ENCOUNTER — Other Ambulatory Visit: Payer: Self-pay | Admitting: Allergy & Immunology

## 2024-05-27 ENCOUNTER — Encounter: Payer: Self-pay | Admitting: Allergy & Immunology
